# Patient Record
Sex: Female | Born: 1945 | Race: White | Hispanic: No | Smoking: Former smoker
Health system: Southern US, Community
[De-identification: ages and names within clinical notes are randomized; demographics above are authoritative.]

## PROBLEM LIST (undated history)

## (undated) DIAGNOSIS — N3281 Overactive bladder: Secondary | ICD-10-CM

## (undated) DIAGNOSIS — I872 Venous insufficiency (chronic) (peripheral): Secondary | ICD-10-CM

## (undated) HISTORY — PX: CHOLECYSTECTOMY: SHX55

## (undated) HISTORY — DX: Venous insufficiency (chronic) (peripheral): I87.2

## (undated) HISTORY — PX: LAPAROSCOPIC VAGINAL HYSTERECTOMY: SUR798

## (undated) HISTORY — PX: APPENDECTOMY: SHX54

---

## 2020-04-29 ENCOUNTER — Other Ambulatory Visit: Payer: Self-pay | Admitting: Family Medicine

## 2020-04-29 DIAGNOSIS — Z1231 Encounter for screening mammogram for malignant neoplasm of breast: Secondary | ICD-10-CM

## 2020-07-29 ENCOUNTER — Other Ambulatory Visit: Payer: Self-pay

## 2020-07-29 ENCOUNTER — Ambulatory Visit
Admission: RE | Admit: 2020-07-29 | Discharge: 2020-07-29 | Disposition: A | Discharge status: Home / Self Care | Payer: Medicare Other | Source: Ambulatory Visit | Attending: Family Medicine | Admitting: Family Medicine

## 2020-07-29 DIAGNOSIS — Z1231 Encounter for screening mammogram for malignant neoplasm of breast: Secondary | ICD-10-CM | POA: Insufficient documentation

## 2020-08-04 ENCOUNTER — Inpatient Hospital Stay
Admission: RE | Admit: 2020-08-04 | Discharge: 2020-08-04 | Disposition: A | Discharge status: Home / Self Care | Payer: Self-pay | Source: Ambulatory Visit | Attending: *Deleted | Admitting: *Deleted

## 2020-08-04 ENCOUNTER — Other Ambulatory Visit: Payer: Self-pay | Admitting: *Deleted

## 2020-08-04 DIAGNOSIS — Z1231 Encounter for screening mammogram for malignant neoplasm of breast: Secondary | ICD-10-CM

## 2020-08-26 ENCOUNTER — Ambulatory Visit
Admission: RE | Admit: 2020-08-26 | Discharge: 2020-08-26 | Disposition: A | Discharge status: Home / Self Care | Payer: Medicare Other | Source: Ambulatory Visit | Attending: Urology | Admitting: Urology

## 2020-08-26 ENCOUNTER — Other Ambulatory Visit: Payer: Self-pay

## 2020-08-26 ENCOUNTER — Ambulatory Visit: Payer: Medicare Other | Admitting: Urology

## 2020-08-26 ENCOUNTER — Encounter: Payer: Self-pay | Admitting: Urology

## 2020-08-26 VITALS — BP 99/62 | HR 97 | Ht 65.0 in | Wt 134.0 lb

## 2020-08-26 DIAGNOSIS — N3946 Mixed incontinence: Secondary | ICD-10-CM

## 2020-08-26 DIAGNOSIS — R109 Unspecified abdominal pain: Secondary | ICD-10-CM

## 2020-08-26 DIAGNOSIS — N200 Calculus of kidney: Secondary | ICD-10-CM

## 2020-08-26 IMAGING — CR DG ABDOMEN 1V
1 series · 2 of 2 positions shown · non-contrast
Comparison: Abdominopelvic CT [DATE]

CLINICAL DATA: Kidney stone. Right-sided pain. Pain with urination.

EXAM:
ABDOMEN - 1 VIEW

[Series 1: dg abd 1 view · 0.14mm/px · 2 of 2 slices shown]
[im 1/2]
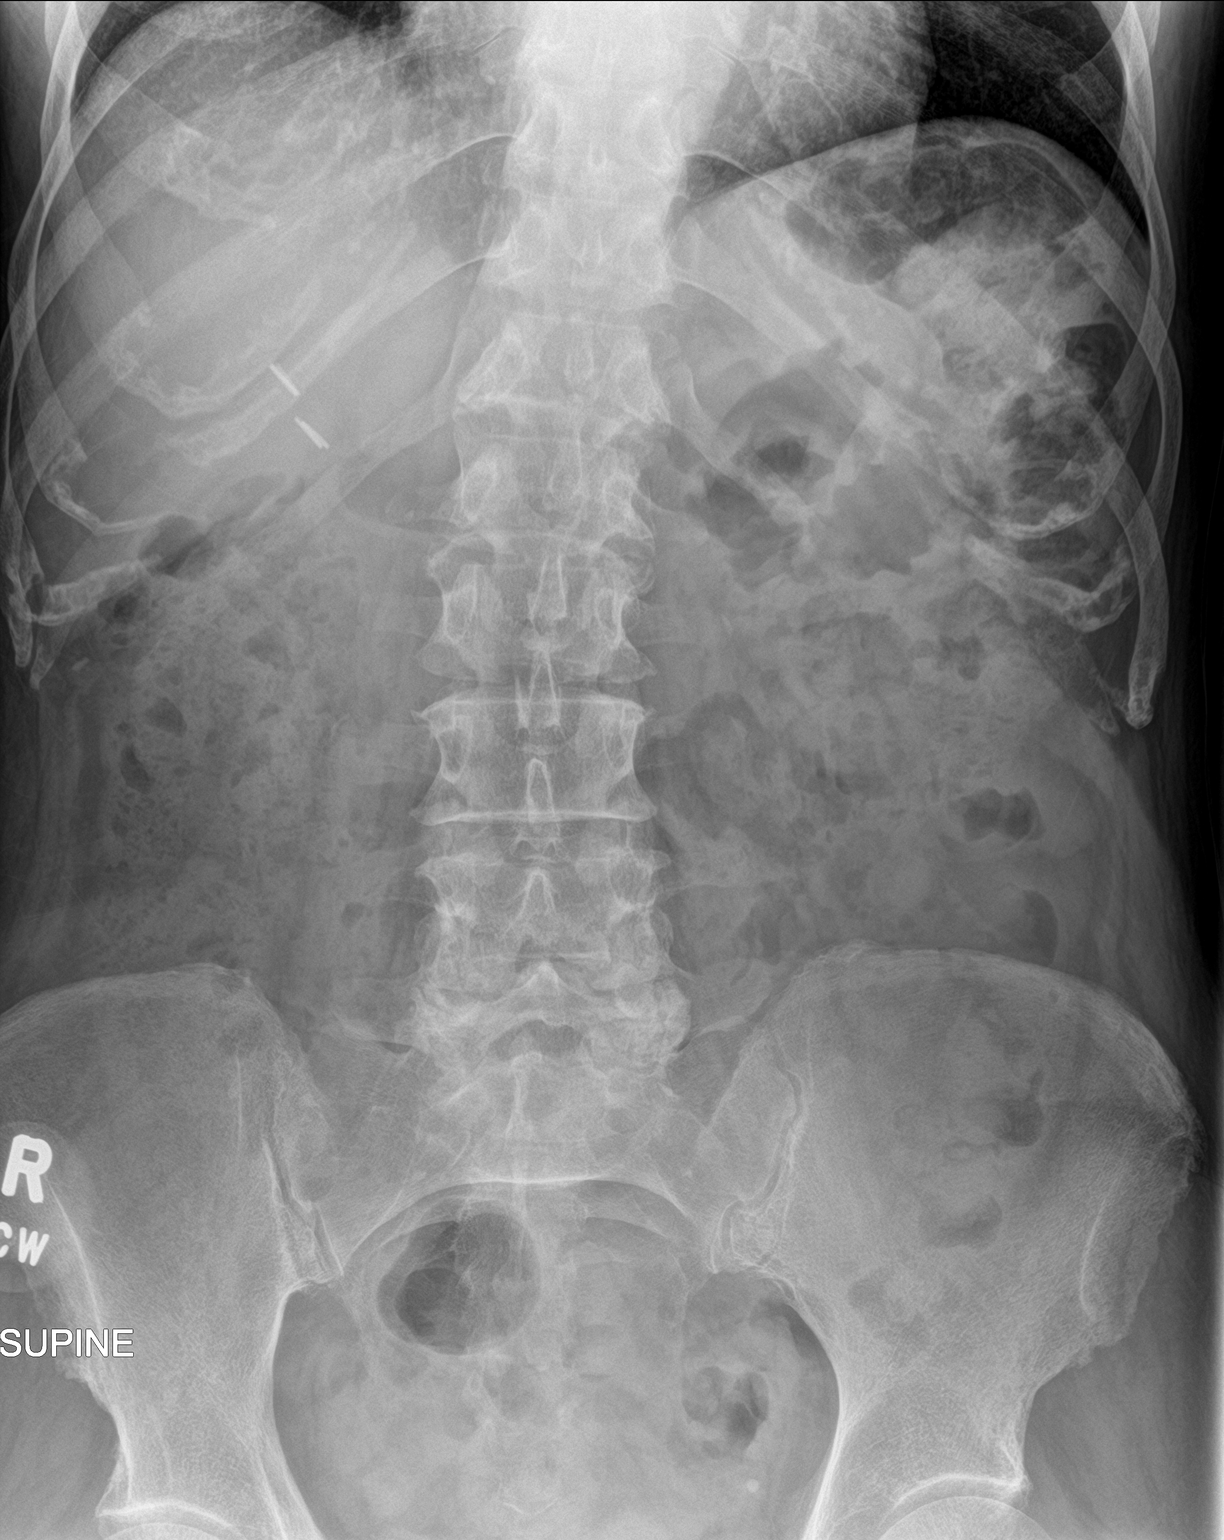
[im 2/2]
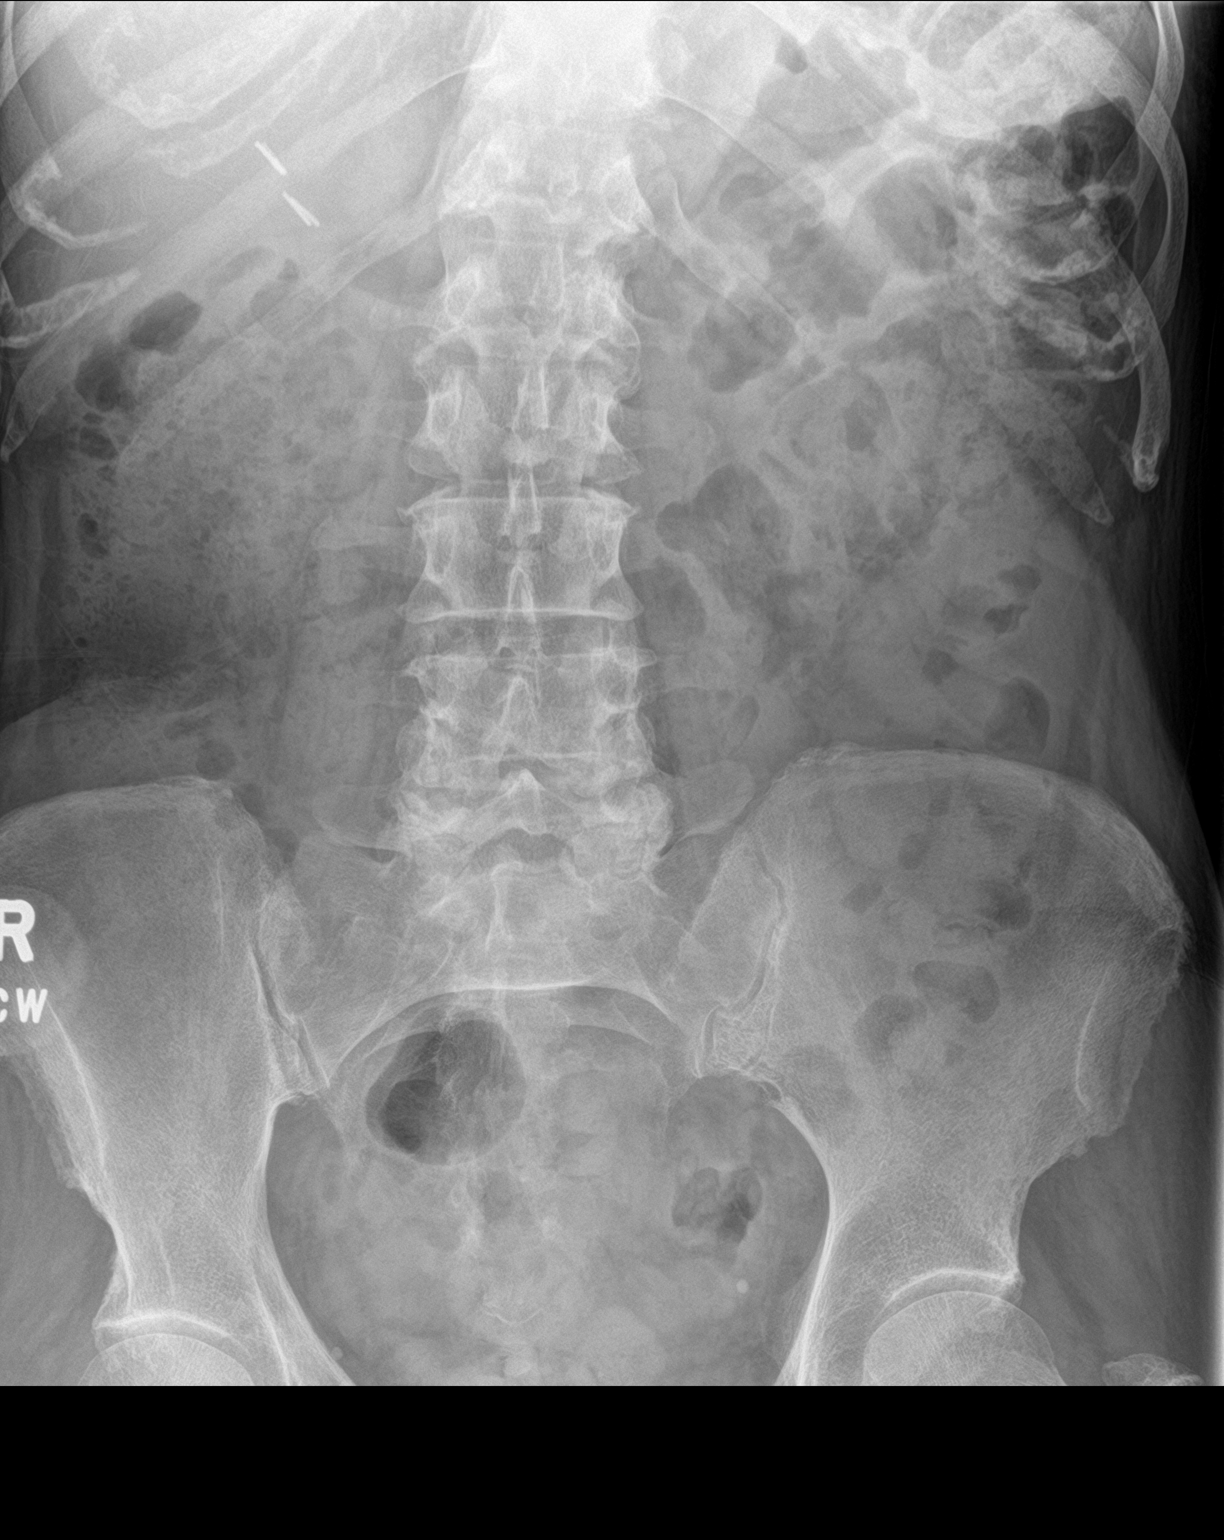

[2 of 2 positions shown; findings below may reference images not displayed]

FINDINGS: No radiopaque calculi or abnormal soft tissue calcifications. Pelvic
calcifications correspond to phleboliths on prior CT. Normal bowel
gas pattern without evidence of obstruction. Moderate stool in the
ascending and transverse colon. Cholecystectomy clips in the right
upper quadrant. No concerning intraabdominal mass effect. No acute
osseous abnormalities are seen
IMPRESSION: 1. No radiopaque calculi.
2. Normal bowel gas pattern.

## 2020-08-26 MED ORDER — OXYBUTYNIN CHLORIDE ER 10 MG PO TB24: 10.0000 mg | ORAL_TABLET | Freq: Every day | ORAL | 0 refills | Status: DC

## 2020-08-26 NOTE — Progress Notes (Signed)
08/26/2020 11:32 AM   Kristina Proctor November 09, 1945 [id number]  Referring provider: Donnamarie Rossetti, PA-C [place],  [place] [address]  Chief Complaint  Patient presents with   Urinary Frequency    HPI: Kristina Proctor is a 75 y.o. female referred for evaluation of overactive bladder.  Saw PCP 08/10/2020 for URI and GI symptoms Referral placed for chronic OAB symptoms Previously followed by Dr. Felipa Eth in [facility] for mixed urinary incontinence; last saw November 2020 Symptoms at those visits remarkable for urinary frequency, urgency with urge and stress incontinence.  Nocturia every 2 hours Initially tried Myrbetriq but discontinued secondary to side effects Was started on Solifenacin with improvement in her symptoms though she does have a side effect of constipation with this medication which is manageable States her co-pay on Solifenacin has increased to $20 per month and she is requesting an alternative medication  States she was also diagnosed with a kidney stone in [place] 2 years ago and for the past several months has noted intermittent right side pain located along the mid axillary line below the costal margin that radiates into the right lower quadrant.  She is inquiring if this could be related to the stone CT scans performed in 2019 and July 2021 showed no renal or ureteral calculi She had seen gastroenterology in June 2021 and the CT was ordered for right flank pain and history of diverticulitis  PMH: Past Medical History:  Diagnosis Date   Venous insufficiency     Surgical History: Past Surgical History:  Procedure Laterality Date   CHOLECYSTECTOMY     LAPAROSCOPIC VAGINAL HYSTERECTOMY      Home Medications:  Allergies as of 08/26/2020       Reactions   Codeine Other (See Comments), Hives   Rapid heart rate Rapid heart rate Rapid heart rate Rapid heart rate Rapid heart rate Rapid heart rate   Hydrocodone Other (See Comments)    Rapid heart rate Rapid heart rate   Oxycodone Other (See Comments), Hives   Rapid heart rate Other reaction(s): Chest Pain Rapid heart rate Rapid heart rate Rapid heart rate Other reaction(s): Chest Pain Rapid heart rate Rapid heart rate Rapid heart rate Rapid heart rate Rapid heart rate   Amitriptyline Nausea Only, Other (See Comments)   Out of focus Out of focus Out of focus Out of focus Out of focus Out of focus   Levofloxacin Rash   Bupropion Nausea Only   Doxycycline Nausea Only   Ciprofloxacin Diarrhea        Medication List        Accurate as of August 26, 2020 11:32 AM. If you have any questions, ask your nurse or doctor.          diclofenac Sodium 1 % Gel Commonly known as: VOLTAREN Apply 2 g topically 4 (four) times daily.   fluconazole 100 MG tablet Commonly known as: DIFLUCAN Take 100 mg by mouth daily.   gentamicin ointment 0.1 % Commonly known as: GARAMYCIN APPLY TO NOSE THREE TIMES DAILY   meloxicam 15 MG tablet Commonly known as: MOBIC Take 15 mg by mouth daily.   mupirocin ointment 2 % Commonly known as: BACTROBAN APPLY TO AFFECTED AREA 3 TIMES A DAY FOR 7 DAYS   omeprazole 40 MG capsule Commonly known as: PRILOSEC Take by mouth.   ondansetron 4 MG disintegrating tablet Commonly known as: ZOFRAN-ODT Take 4 mg by mouth every 8 (eight) hours as needed.   senna-docusate 8.6-50 MG  tablet Commonly known as: Senokot-S Take 1 tablet by mouth daily.   solifenacin 10 MG tablet Commonly known as: VESICARE Take 1 tablet by mouth daily.   triamcinolone cream 0.1 % Commonly known as: KENALOG Apply topically 2 (two) times daily.        Allergies:  Allergies  Allergen Reactions   Codeine Other (See Comments) and Hives    Rapid heart rate Rapid heart rate Rapid heart rate Rapid heart rate Rapid heart rate Rapid heart rate    Hydrocodone Other (See Comments)    Rapid heart rate Rapid heart rate    Oxycodone Other (See  Comments) and Hives    Rapid heart rate Other reaction(s): Chest Pain Rapid heart rate Rapid heart rate Rapid heart rate Other reaction(s): Chest Pain Rapid heart rate Rapid heart rate Rapid heart rate Rapid heart rate Rapid heart rate    Amitriptyline Nausea Only and Other (See Comments)    Out of focus Out of focus Out of focus Out of focus Out of focus Out of focus    Levofloxacin Rash   Bupropion Nausea Only   Doxycycline Nausea Only   Ciprofloxacin Diarrhea    Family History: Family History  Problem Relation Age of Onset   Breast cancer Maternal Aunt    Breast cancer Paternal Aunt     Social History:  reports that she has quit smoking. Her smoking use included cigarettes. She has never used smokeless tobacco. She reports that she does not drink alcohol. No history on file for drug use.   Physical Exam: BP 99/62   Pulse 97   Ht '5\' 5"'$  (1.651 m)   Wt 134 lb (60.8 kg)   BMI 22.30 kg/m   Constitutional:  Alert and oriented, No acute distress. HEENT: [place] AT, moist mucus membranes.  Trachea midline, no masses. Cardiovascular: No clubbing, cyanosis, or edema. Respiratory: Normal respiratory effort, no increased work of breathing. Psychiatric: Normal mood and affect.  Laboratory Data:  Urinalysis Urinalysis 08/10/2020 at [facility] was negative   Assessment & Plan:    1.  Mixed urinary incontinence Urge most bothersome Solifenacin has been effective and I did inform her of the availability of Solifenacin through a different pharmacy that honors good Rx.  The [facility] price is $28 for a 90-day supply.  I offered to send this into a different pharmacy however she prefers to get her medications through [facility] which is more convenient Rx oxybutynin sent as an alternative and if she sees increase side effects she will call back to resume Solifenacin  2.  Right side pain KUB was ordered Since she had a CT 1 year ago for similar symptoms would not recommend  repeating additional imaging if KUB is negative Pain most likely musculoskeletal in etiology   Abbie Sons, MD  [facility] [address] [place], [place] [address] [phone]

## 2020-08-28 ENCOUNTER — Telehealth: Payer: Self-pay | Admitting: *Deleted

## 2020-08-28 NOTE — Telephone Encounter (Signed)
Notified patient as instructed.   

## 2020-08-28 NOTE — Telephone Encounter (Signed)
-----   Message from Abbie Sons, MD sent at 08/28/2020  2:18 PM EDT ----- KUB showed no evidence of stone.  I also reviewed her CT report from 08/01/2019 and the radiology report stated no kidney stones were seen

## 2020-09-23 ENCOUNTER — Other Ambulatory Visit: Payer: Self-pay | Admitting: Urology

## 2020-10-20 ENCOUNTER — Other Ambulatory Visit: Payer: Self-pay | Admitting: Urology

## 2021-02-09 ENCOUNTER — Ambulatory Visit
Admission: RE | Admit: 2021-02-09 | Discharge: 2021-02-09 | Disposition: A | Discharge status: Home / Self Care | Payer: Medicare HMO | Source: Ambulatory Visit | Attending: Family Medicine | Admitting: Family Medicine

## 2021-02-09 ENCOUNTER — Other Ambulatory Visit (HOSPITAL_COMMUNITY): Payer: Self-pay | Admitting: Family Medicine

## 2021-02-09 ENCOUNTER — Other Ambulatory Visit: Payer: Self-pay | Admitting: Family Medicine

## 2021-02-09 DIAGNOSIS — R1013 Epigastric pain: Secondary | ICD-10-CM | POA: Diagnosis not present

## 2021-02-09 DIAGNOSIS — R3989 Other symptoms and signs involving the genitourinary system: Secondary | ICD-10-CM | POA: Diagnosis not present

## 2021-02-09 DIAGNOSIS — R109 Unspecified abdominal pain: Secondary | ICD-10-CM

## 2021-02-09 DIAGNOSIS — R300 Dysuria: Secondary | ICD-10-CM | POA: Diagnosis not present

## 2021-02-09 DIAGNOSIS — R110 Nausea: Secondary | ICD-10-CM | POA: Diagnosis not present

## 2021-02-09 DIAGNOSIS — K7689 Other specified diseases of liver: Secondary | ICD-10-CM | POA: Diagnosis not present

## 2021-02-09 DIAGNOSIS — K5730 Diverticulosis of large intestine without perforation or abscess without bleeding: Secondary | ICD-10-CM | POA: Diagnosis not present

## 2021-02-09 DIAGNOSIS — I700 Atherosclerosis of aorta: Secondary | ICD-10-CM | POA: Diagnosis not present

## 2021-02-09 LAB — POCT I-STAT CREATININE: Creatinine, Ser: 0.7 mg/dL (ref 0.44–1.00)

## 2021-02-09 MED ORDER — IOHEXOL 300 MG/ML  SOLN
80.0000 mL | Freq: Once | INTRAMUSCULAR | Status: AC | PRN
  Administered 2021-02-09: 80 mL via INTRAVENOUS

## 2021-02-25 DIAGNOSIS — R0602 Shortness of breath: Secondary | ICD-10-CM | POA: Diagnosis not present

## 2021-02-25 DIAGNOSIS — R931 Abnormal findings on diagnostic imaging of heart and coronary circulation: Secondary | ICD-10-CM | POA: Diagnosis not present

## 2021-02-25 DIAGNOSIS — I2510 Atherosclerotic heart disease of native coronary artery without angina pectoris: Secondary | ICD-10-CM | POA: Diagnosis not present

## 2021-02-25 DIAGNOSIS — R0789 Other chest pain: Secondary | ICD-10-CM | POA: Diagnosis not present

## 2021-02-25 DIAGNOSIS — R9439 Abnormal result of other cardiovascular function study: Secondary | ICD-10-CM | POA: Diagnosis not present

## 2021-02-26 DIAGNOSIS — I2510 Atherosclerotic heart disease of native coronary artery without angina pectoris: Secondary | ICD-10-CM | POA: Diagnosis not present

## 2021-02-26 DIAGNOSIS — I208 Other forms of angina pectoris: Secondary | ICD-10-CM | POA: Diagnosis not present

## 2021-03-05 ENCOUNTER — Encounter
Admission: RE | Disposition: A | Discharge status: Home / Self Care | Payer: Self-pay | Source: Home / Self Care | Attending: Cardiology

## 2021-03-05 ENCOUNTER — Other Ambulatory Visit: Payer: Self-pay

## 2021-03-05 ENCOUNTER — Ambulatory Visit
Admission: RE | Admit: 2021-03-05 | Discharge: 2021-03-05 | Disposition: A | Discharge status: Home / Self Care | Payer: Medicare HMO | Attending: Cardiology | Admitting: Cardiology

## 2021-03-05 ENCOUNTER — Encounter: Payer: Self-pay | Admitting: Cardiology

## 2021-03-05 DIAGNOSIS — I25118 Atherosclerotic heart disease of native coronary artery with other forms of angina pectoris: Secondary | ICD-10-CM | POA: Diagnosis not present

## 2021-03-05 DIAGNOSIS — I2089 Other forms of angina pectoris: Secondary | ICD-10-CM | POA: Diagnosis present

## 2021-03-05 DIAGNOSIS — I208 Other forms of angina pectoris: Secondary | ICD-10-CM | POA: Diagnosis present

## 2021-03-05 DIAGNOSIS — Z9289 Personal history of other medical treatment: Secondary | ICD-10-CM

## 2021-03-05 DIAGNOSIS — I25119 Atherosclerotic heart disease of native coronary artery with unspecified angina pectoris: Secondary | ICD-10-CM | POA: Diagnosis not present

## 2021-03-05 DIAGNOSIS — Z87891 Personal history of nicotine dependence: Secondary | ICD-10-CM | POA: Insufficient documentation

## 2021-03-05 DIAGNOSIS — Z955 Presence of coronary angioplasty implant and graft: Secondary | ICD-10-CM | POA: Diagnosis not present

## 2021-03-05 HISTORY — DX: Overactive bladder: N32.81

## 2021-03-05 HISTORY — PX: CORONARY STENT INTERVENTION: CATH118234

## 2021-03-05 HISTORY — PX: LEFT HEART CATH AND CORONARY ANGIOGRAPHY: CATH118249

## 2021-03-05 LAB — POCT ACTIVATED CLOTTING TIME
Activated Clotting Time: 263 seconds
Activated Clotting Time: 317 seconds

## 2021-03-05 SURGERY — LEFT HEART CATH AND CORONARY ANGIOGRAPHY
Anesthesia: Moderate Sedation

## 2021-03-05 MED ORDER — ASPIRIN 81 MG PO CHEW
CHEWABLE_TABLET | ORAL | Status: AC
  Filled 2021-03-05: qty 3

## 2021-03-05 MED ORDER — VERAPAMIL HCL 2.5 MG/ML IV SOLN
INTRAVENOUS | Status: DC | PRN
  Administered 2021-03-05: 10 mg via INTRA_ARTERIAL

## 2021-03-05 MED ORDER — SODIUM CHLORIDE 0.9 % IV SOLN: 250.0000 mL | INTRAVENOUS | Status: DC | PRN

## 2021-03-05 MED ORDER — VERAPAMIL HCL 2.5 MG/ML IV SOLN
INTRAVENOUS | Status: AC
  Filled 2021-03-05: qty 2

## 2021-03-05 MED ORDER — SODIUM CHLORIDE 0.9 % IV SOLN: INTRAVENOUS | Status: DC

## 2021-03-05 MED ORDER — MIDAZOLAM HCL 2 MG/2ML IJ SOLN
INTRAMUSCULAR | Status: DC | PRN
  Administered 2021-03-05: 1 mg via INTRAVENOUS

## 2021-03-05 MED ORDER — ROSUVASTATIN CALCIUM 10 MG PO TABS: 10.0000 mg | ORAL_TABLET | Freq: Every day | ORAL | Status: DC

## 2021-03-05 MED ORDER — CLOPIDOGREL BISULFATE 75 MG PO TABS
ORAL_TABLET | ORAL | Status: AC
  Filled 2021-03-05: qty 8

## 2021-03-05 MED ORDER — FENTANYL CITRATE (PF) 100 MCG/2ML IJ SOLN
INTRAMUSCULAR | Status: AC
  Filled 2021-03-05: qty 2

## 2021-03-05 MED ORDER — MELATONIN 5 MG PO TABS
10.0000 mg | ORAL_TABLET | Freq: Every evening | ORAL | Status: DC | PRN
  Filled 2021-03-05: qty 2

## 2021-03-05 MED ORDER — BACLOFEN 10 MG PO TABS: 10.0000 mg | ORAL_TABLET | Freq: Every day | ORAL | Status: DC

## 2021-03-05 MED ORDER — CLOPIDOGREL BISULFATE 75 MG PO TABS: 75.0000 mg | ORAL_TABLET | Freq: Every day | ORAL | Status: DC

## 2021-03-05 MED ORDER — CLOPIDOGREL BISULFATE 75 MG PO TABS
75.0000 mg | ORAL_TABLET | Freq: Every day | ORAL | 3 refills | Status: AC
Start: 2021-03-05 — End: 2022-03-05

## 2021-03-05 MED ORDER — LIDOCAINE HCL (PF) 1 % IJ SOLN
INTRAMUSCULAR | Status: DC | PRN
  Administered 2021-03-05: 2 mL

## 2021-03-05 MED ORDER — HEPARIN (PORCINE) IN NACL 1000-0.9 UT/500ML-% IV SOLN
INTRAVENOUS | Status: DC | PRN
  Administered 2021-03-05 (×2): 500 mL

## 2021-03-05 MED ORDER — IOHEXOL 300 MG/ML  SOLN
INTRAMUSCULAR | Status: DC | PRN
  Administered 2021-03-05: 105 mL

## 2021-03-05 MED ORDER — SODIUM CHLORIDE 0.9% FLUSH: 3.0000 mL | Freq: Two times a day (BID) | INTRAVENOUS | Status: DC

## 2021-03-05 MED ORDER — SODIUM CHLORIDE 0.9 % IV SOLN: INTRAVENOUS | Status: AC

## 2021-03-05 MED ORDER — SODIUM CHLORIDE 0.9% FLUSH: 3.0000 mL | INTRAVENOUS | Status: DC | PRN

## 2021-03-05 MED ORDER — HEPARIN (PORCINE) IN NACL 1000-0.9 UT/500ML-% IV SOLN
INTRAVENOUS | Status: AC
  Filled 2021-03-05: qty 1000

## 2021-03-05 MED ORDER — TRAZODONE HCL 50 MG PO TABS: 50.0000 mg | ORAL_TABLET | Freq: Every day | ORAL | Status: DC

## 2021-03-05 MED ORDER — HEPARIN SODIUM (PORCINE) 1000 UNIT/ML IJ SOLN
INTRAMUSCULAR | Status: DC | PRN
  Administered 2021-03-05: 2000 [IU] via INTRAVENOUS
  Administered 2021-03-05: 3000 [IU] via INTRAVENOUS
  Administered 2021-03-05: 4000 [IU] via INTRAVENOUS

## 2021-03-05 MED ORDER — FLUTICASONE PROPIONATE 50 MCG/ACT NA SUSP
2.0000 | Freq: Every day | NASAL | Status: DC
Start: 2021-03-06 — End: 2021-03-05

## 2021-03-05 MED ORDER — METOPROLOL SUCCINATE ER 50 MG PO TB24: 25.0000 mg | ORAL_TABLET | Freq: Every day | ORAL | Status: DC

## 2021-03-05 MED ORDER — ASPIRIN 81 MG PO CHEW
324.0000 mg | CHEWABLE_TABLET | ORAL | Status: AC
  Administered 2021-03-05: 243 mg via ORAL

## 2021-03-05 MED ORDER — CLOPIDOGREL BISULFATE 75 MG PO TABS
ORAL_TABLET | ORAL | Status: DC | PRN
  Administered 2021-03-05: 600 mg via ORAL

## 2021-03-05 MED ORDER — DIPHENHYDRAMINE HCL 25 MG PO TABS: 25.0000 mg | ORAL_TABLET | Freq: Every day | ORAL | Status: DC

## 2021-03-05 MED ORDER — MIDAZOLAM HCL 2 MG/2ML IJ SOLN
INTRAMUSCULAR | Status: AC
  Filled 2021-03-05: qty 2

## 2021-03-05 MED ORDER — HEPARIN SODIUM (PORCINE) 1000 UNIT/ML IJ SOLN
INTRAMUSCULAR | Status: AC
  Filled 2021-03-05: qty 10

## 2021-03-05 MED ORDER — ACETAMINOPHEN 325 MG PO TABS: 650.0000 mg | ORAL_TABLET | ORAL | Status: DC | PRN

## 2021-03-05 MED ORDER — ASPIRIN EC 81 MG PO TBEC: 81.0000 mg | DELAYED_RELEASE_TABLET | Freq: Every day | ORAL | Status: DC

## 2021-03-05 MED ORDER — ONDANSETRON HCL 4 MG/2ML IJ SOLN: 4.0000 mg | Freq: Four times a day (QID) | INTRAMUSCULAR | Status: DC | PRN

## 2021-03-05 SURGICAL SUPPLY — 21 items
BALLN TREK RX 2.25X12 (BALLOONS) ×2
BALLN ~~LOC~~ EUPHORA RX 2.5X15 (BALLOONS) ×2
BALLOON TREK RX 2.25X12 (BALLOONS) IMPLANT
BALLOON ~~LOC~~ EUPHORA RX 2.5X15 (BALLOONS) IMPLANT
CATH EAGLE EYE PLAT IMAGING (CATHETERS) ×1 IMPLANT
CATH INFINITI JR4 5F (CATHETERS) ×1 IMPLANT
CATH LAUNCHER 5F EBU3.5 (CATHETERS) ×1 IMPLANT
CATH LAUNCHER 6FR EBU3.5 (CATHETERS) ×1 IMPLANT
DEVICE RAD TR BAND REGULAR (VASCULAR PRODUCTS) ×1 IMPLANT
DRAPE BRACHIAL (DRAPES) ×1 IMPLANT
GLIDESHEATH SLEND SS 6F .021 (SHEATH) ×1 IMPLANT
GUIDEWIRE INQWIRE 1.5J.035X260 (WIRE) IMPLANT
INQWIRE 1.5J .035X260CM (WIRE) ×2
KIT ENCORE 26 ADVANTAGE (KITS) ×1 IMPLANT
PACK CARDIAC CATH (CUSTOM PROCEDURE TRAY) ×2 IMPLANT
PROTECTION STATION PRESSURIZED (MISCELLANEOUS) ×2
SET ATX SIMPLICITY (MISCELLANEOUS) ×1 IMPLANT
STATION PROTECTION PRESSURIZED (MISCELLANEOUS) IMPLANT
STENT ONYX FRONTIER 2.5X18 (Permanent Stent) ×1 IMPLANT
TUBING CIL FLEX 10 FLL-RA (TUBING) ×1 IMPLANT
WIRE RUNTHROUGH .014X180CM (WIRE) ×1 IMPLANT

## 2021-03-05 NOTE — H&P (Signed)
[facility] Cardiology History and Physical  Patient ID: Kristina Proctor MRN: [id number] DOB/AGE: 10/18/1945 76 y.o. Admit date: 03/05/2021  Primary Care Physician: Donnamarie Rossetti, PA-C Primary Cardiologist Andrez Grime, MD   HPI:  Kristina Proctor is a 76 year old female with history of tobacco use who was recently evaluated for shortness of breath with exertion and abnormal stress test.  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  She is referred for heart catheterization for further evaluation.    Past Medical History:  Diagnosis Date   Overactive bladder    Venous insufficiency     Past Surgical History:  Procedure Laterality Date   APPENDECTOMY     CHOLECYSTECTOMY     LAPAROSCOPIC VAGINAL HYSTERECTOMY      Medications Prior to Admission  Medication Sig Dispense Refill Last Dose   aspirin EC 81 MG tablet Take 81 mg by mouth daily. Swallow whole.   03/05/2021   baclofen (LIORESAL) 10 MG tablet Take 10 mg by mouth daily.   03/04/2021   Calcium Carb-Cholecalciferol 500-10 MG-MCG CHEW Chew 2 each by mouth daily.   03/04/2021   diclofenac Sodium (VOLTAREN) 1 % GEL Apply 2 g topically 4 (four) times daily as needed (pain).   Past Week   diphenhydrAMINE (BENADRYL) 25 MG tablet Take 25 mg by mouth daily.   03/04/2021   estradiol (ESTRACE) 0.1 MG/GM vaginal cream Place 1 Applicatorful vaginally once a week.   Past Week   fluticasone (FLONASE) 50 MCG/ACT nasal spray Place 2 sprays into both nostrils daily.   03/04/2021   gentamicin ointment (GARAMYCIN) 0.1 % Apply 1 application topically 3 (three) times daily as needed (irritation around nose).   Past Week   Melatonin 10 MG TABS Take 10 mg by mouth at bedtime as needed (sleep).   03/04/2021   metoprolol succinate (TOPROL-XL) 25 MG 24 hr tablet Take 25 mg by mouth daily.   03/05/2021   Multiple Vitamins-Minerals (MULTIVITAMIN WITH MINERALS) tablet Take 2 tablets by mouth daily.   03/04/2021   Peppermint Oil (IBGARD) 90  MG CPCR Take 90 mg by mouth daily.   03/04/2021   rosuvastatin (CRESTOR) 10 MG tablet Take 10 mg by mouth daily.   03/04/2021   solifenacin (VESICARE) 10 MG tablet Take 10 mg by mouth daily as needed (overactive bladder).   03/04/2021   traZODone (DESYREL) 50 MG tablet Take 50 mg by mouth at bedtime.   03/04/2021   Social History   Socioeconomic History   Marital status: Widowed    Spouse name: Not on file   Number of children: Not on file   Years of education: Not on file   Highest education level: Not on file  Occupational History   Not on file  Tobacco Use   Smoking status: Former    Types: Cigarettes   Smokeless tobacco: Never  Substance and Sexual Activity   Alcohol use: Never   Drug use: Never   Sexual activity: Not on file  Other Topics Concern   Not on file  Social History Narrative   ** Merged History Encounter **       Social Determinants of Health   Financial Resource Strain: Not on file  Food Insecurity: Not on file  Transportation Needs: Not on file  Physical Activity: Not on file  Stress: Not on file  Social Connections: Not on file  Intimate Partner Violence: Not on file    Family History  Problem Relation Age of Onset  Breast cancer Maternal Aunt    Breast cancer Paternal Aunt       Review of systems complete and found to be negative unless listed above      Physical Exam:  General: Well developed, well nourished, in no acute distress HEENT:  Normocephalic and atramatic Neck:  No JVD.  Lungs: Clear bilaterally to auscultation and percussion. Heart: HRRR . Normal S1 and S2 without gallops or murmurs.  Abdomen: Bowel sounds are positive, abdomen soft and non-tender  Msk:  Back normal, normal gait. Normal strength and tone for age. Extremities: No clubbing, cyanosis or edema.   Neuro: Alert and oriented X 3. Psych:  Good affect, responds appropriately   Labs:  No results found for: WBC, HGB, HCT, MCV, PLT No results for input(s): NA, K, CL, CO2,  BUN, CREATININE, CALCIUM, PROT, BILITOT, ALKPHOS, ALT, AST, GLUCOSE in the last 168 hours.  Invalid input(s): LABALBU No results found for: CKTOTAL, CKMB, CKMBINDEX, TROPONINI No results found for: CHOL No results found for: HDL No results found for: LDLCALC No results found for: TRIG No results found for: CHOLHDL No results found for: LDLDIRECT    Radiology: CT ABDOMEN PELVIS W CONTRAST  Result Date: 02/09/2021 CLINICAL DATA:  Right flank pain for 2 days.  Dysuria. EXAM: CT ABDOMEN AND PELVIS WITH CONTRAST TECHNIQUE: Multidetector CT imaging of the abdomen and pelvis was performed using the standard protocol following bolus administration of intravenous contrast. CONTRAST:  61mL OMNIPAQUE IOHEXOL 300 MG/ML  SOLN COMPARISON:  08/01/2019 FINDINGS: Lower Chest: No acute findings. Hepatobiliary: No hepatic masses identified. Several small hepatic cysts remains stable. Prior cholecystectomy again noted. Mild biliary ductal dilatation is also unchanged. Pancreas: No mass or inflammatory changes. No evidence pancreatic ductal dilatation. Spleen: Within normal limits in size and appearance. Adrenals/Urinary Tract: No masses identified. No evidence of ureteral calculi or hydronephrosis. Stomach/Bowel: No evidence of obstruction, inflammatory process or abnormal fluid collections. Diverticulosis is seen mainly involving the descending and sigmoid colon, however there is no evidence of diverticulitis. Vascular/Lymphatic: No pathologically enlarged lymph nodes. No acute vascular findings. Aortic atherosclerotic calcification noted. Reproductive: Prior hysterectomy noted. Adnexal regions are unremarkable in appearance. Other:  None. Musculoskeletal:  No suspicious bone lesions identified. IMPRESSION: No acute findings. Colonic diverticulosis, without radiographic evidence of diverticulitis. Aortic Atherosclerosis (ICD10-I70.0). Electronically Signed   By: Marlaine Hind M.D.   On: 02/09/2021 17:44    EKG:  Normal  ASSESSMENT AND PLAN:  76 year old female with history of tobacco use who presents following a abnormal stress test and cardiac CTA showing a 90% proximal LAD lesion.  After discussion with the patient we will proceed with heart catheterization with possible intervention for further evaluation.  The risks and benefits were discussed with the patient and her daughter who are agreeable.  To proceed.  We will proceed with a right radial artery approach.  Signed: Andrez Grime MD 03/05/2021, 10:14 AM

## 2021-03-08 ENCOUNTER — Encounter: Payer: Self-pay | Admitting: Cardiology

## 2021-03-15 ENCOUNTER — Encounter: Payer: Medicare HMO | Attending: Cardiology

## 2021-03-15 ENCOUNTER — Other Ambulatory Visit: Payer: Self-pay

## 2021-03-15 DIAGNOSIS — I208 Other forms of angina pectoris: Secondary | ICD-10-CM | POA: Insufficient documentation

## 2021-03-15 DIAGNOSIS — Z955 Presence of coronary angioplasty implant and graft: Secondary | ICD-10-CM | POA: Insufficient documentation

## 2021-03-15 NOTE — Progress Notes (Signed)
Virtual Visit completed. Patient informed on EP and RD appointment and 6 Minute walk test. Patient also informed of patient health questionnaires on My Chart. Patient Verbalizes understanding. Visit diagnosis can be found in [facility] 03/05/2021.

## 2021-03-16 DIAGNOSIS — M79641 Pain in right hand: Secondary | ICD-10-CM | POA: Diagnosis not present

## 2021-03-17 DIAGNOSIS — M9904 Segmental and somatic dysfunction of sacral region: Secondary | ICD-10-CM | POA: Diagnosis not present

## 2021-03-17 DIAGNOSIS — M9905 Segmental and somatic dysfunction of pelvic region: Secondary | ICD-10-CM | POA: Diagnosis not present

## 2021-03-17 DIAGNOSIS — M5431 Sciatica, right side: Secondary | ICD-10-CM | POA: Diagnosis not present

## 2021-03-17 DIAGNOSIS — M9903 Segmental and somatic dysfunction of lumbar region: Secondary | ICD-10-CM | POA: Diagnosis not present

## 2021-03-24 DIAGNOSIS — M545 Low back pain, unspecified: Secondary | ICD-10-CM | POA: Diagnosis not present

## 2021-03-24 DIAGNOSIS — I200 Unstable angina: Secondary | ICD-10-CM | POA: Diagnosis not present

## 2021-03-24 DIAGNOSIS — Z0000 Encounter for general adult medical examination without abnormal findings: Secondary | ICD-10-CM | POA: Diagnosis not present

## 2021-03-24 DIAGNOSIS — I208 Other forms of angina pectoris: Secondary | ICD-10-CM | POA: Diagnosis not present

## 2021-03-24 DIAGNOSIS — M461 Sacroiliitis, not elsewhere classified: Secondary | ICD-10-CM | POA: Diagnosis not present

## 2021-03-24 DIAGNOSIS — M79641 Pain in right hand: Secondary | ICD-10-CM | POA: Diagnosis not present

## 2021-03-24 DIAGNOSIS — I2510 Atherosclerotic heart disease of native coronary artery without angina pectoris: Secondary | ICD-10-CM | POA: Diagnosis not present

## 2021-03-24 DIAGNOSIS — R232 Flushing: Secondary | ICD-10-CM | POA: Diagnosis not present

## 2021-03-24 DIAGNOSIS — G8929 Other chronic pain: Secondary | ICD-10-CM | POA: Diagnosis not present

## 2021-03-29 ENCOUNTER — Encounter: Payer: Medicare HMO | Admitting: *Deleted

## 2021-03-29 ENCOUNTER — Other Ambulatory Visit: Payer: Self-pay

## 2021-03-29 VITALS — Ht 65.75 in | Wt 147.4 lb

## 2021-03-29 DIAGNOSIS — Z955 Presence of coronary angioplasty implant and graft: Secondary | ICD-10-CM

## 2021-03-29 DIAGNOSIS — I208 Other forms of angina pectoris: Secondary | ICD-10-CM | POA: Diagnosis not present

## 2021-03-29 NOTE — Patient Instructions (Signed)
Patient Instructions  Patient Details  Name: Kristina Proctor MRN: [id number] Date of Birth: 1946/01/08 Referring Provider:  Andrez Grime, MD  Below are your personal goals for exercise, nutrition, and risk factors. Our goal is to help you stay on track towards obtaining and maintaining these goals. We will be discussing your progress on these goals with you throughout the program.  Initial Exercise Prescription:  Initial Exercise Prescription - 03/29/21 1600       Date of Initial Exercise RX and Referring Provider   Date 03/29/21    Referring Provider Corky Sox      Oxygen   Maintain Oxygen Saturation 88% or higher      Treadmill   MPH 3    Grade 1    Minutes 15    METs 3.71      Recumbant Bike   Level 2    RPM 60    Minutes 15    METs 3.41      REL-XR   Level 3    Speed 50    Minutes 15    METs 3.41      T5 Nustep   Level 2    SPM 80    Minutes 15    METs 3.41      Prescription Details   Frequency (times per week) 2    Duration Progress to 30 minutes of continuous aerobic without signs/symptoms of physical distress      Intensity   THRR 40-80% of Max Heartrate 102-130    Ratings of Perceived Exertion 11-13    Perceived Dyspnea 0-4      Progression   Progression Continue to progress workloads to maintain intensity without signs/symptoms of physical distress.      Resistance Training   Training Prescription Yes    Weight 2    Reps 10-15             Exercise Goals: Frequency: Be able to perform aerobic exercise two to three times per week in program working toward 2-5 days per week of home exercise.  Intensity: Work with a perceived exertion of 11 (fairly light) - 15 (hard) while following your exercise prescription.  We will make changes to your prescription with you as you progress through the program.   Duration: Be able to do 30 to 45 minutes of continuous aerobic exercise in addition to a 5 minute warm-up and a 5 minute cool-down routine.    Nutrition Goals: Your personal nutrition goals will be established when you do your nutrition analysis with the dietician.  The following are general nutrition guidelines to follow: Cholesterol < 200mg /day Sodium < 1500mg /day Fiber: Women over 50 yrs - 21 grams per day  Personal Goals:  Personal Goals and Risk Factors at Admission - 03/29/21 1610       Core Components/Risk Factors/Patient Goals on Admission    Weight Management Yes;Weight Maintenance    Intervention Weight Management: Develop a combined nutrition and exercise program designed to reach desired caloric intake, while maintaining appropriate intake of nutrient and fiber, sodium and fats, and appropriate energy expenditure required for the weight goal.;Weight Management: Provide education and appropriate resources to help participant work on and attain dietary goals.;Weight Management/Obesity: Establish reasonable short term and long term weight goals.    Admit Weight 147 lb 6.4 oz (66.9 kg)    Goal Weight: Short Term 147 lb 6.4 oz (66.9 kg)    Goal Weight: Long Term 147 lb 6.4 oz (66.9 kg)    Expected  Outcomes Short Term: Continue to assess and modify interventions until short term weight is achieved;Long Term: Adherence to nutrition and physical activity/exercise program aimed toward attainment of established weight goal;Weight Maintenance: Understanding of the daily nutrition guidelines, which includes 25-35% calories from fat, 7% or less cal from saturated fats, less than 200mg  cholesterol, less than 1.5gm of sodium, & 5 or more servings of fruits and vegetables daily;Understanding recommendations for meals to include 15-35% energy as protein, 25-35% energy from fat, 35-60% energy from carbohydrates, less than 200mg  of dietary cholesterol, 20-35 gm of total fiber daily;Understanding of distribution of calorie intake throughout the day with the consumption of 4-5 meals/snacks    Lipids Yes    Intervention Provide education and  support for participant on nutrition & aerobic/resistive exercise along with prescribed medications to achieve LDL 70mg , HDL >40mg .    Expected Outcomes Short Term: Participant states understanding of desired cholesterol values and is compliant with medications prescribed. Participant is following exercise prescription and nutrition guidelines.;Long Term: Cholesterol controlled with medications as prescribed, with individualized exercise RX and with personalized nutrition plan. Value goals: LDL < 70mg , HDL > 40 mg.             Tobacco Use Initial Evaluation: Social History   Tobacco Use  Smoking Status Former   Packs/day: 1.00   Years: 30.00   Pack years: 30.00   Types: Cigarettes   Quit date: 03/03/2016   Years since quitting: 5.0  Smokeless Tobacco Never    Exercise Goals and Review:  Exercise Goals     Row Name 03/29/21 1608             Exercise Goals   Increase Physical Activity Yes       Intervention Provide advice, education, support and counseling about physical activity/exercise needs.;Develop an individualized exercise prescription for aerobic and resistive training based on initial evaluation findings, risk stratification, comorbidities and participant's personal goals.       Expected Outcomes Short Term: Attend rehab on a regular basis to increase amount of physical activity.;Long Term: Add in home exercise to make exercise part of routine and to increase amount of physical activity.;Long Term: Exercising regularly at least 3-5 days a week.       Increase Strength and Stamina Yes       Intervention Provide advice, education, support and counseling about physical activity/exercise needs.;Develop an individualized exercise prescription for aerobic and resistive training based on initial evaluation findings, risk stratification, comorbidities and participant's personal goals.       Expected Outcomes Short Term: Increase workloads from initial exercise prescription for  resistance, speed, and METs.;Short Term: Perform resistance training exercises routinely during rehab and add in resistance training at home;Long Term: Improve cardiorespiratory fitness, muscular endurance and strength as measured by increased METs and functional capacity (6MWT)       Able to understand and use rate of perceived exertion (RPE) scale Yes       Intervention Provide education and explanation on how to use RPE scale       Expected Outcomes Short Term: Able to use RPE daily in rehab to express subjective intensity level;Long Term:  Able to use RPE to guide intensity level when exercising independently       Able to understand and use Dyspnea scale Yes       Intervention Provide education and explanation on how to use Dyspnea scale       Expected Outcomes Short Term: Able to use Dyspnea scale daily in  rehab to express subjective sense of shortness of breath during exertion;Long Term: Able to use Dyspnea scale to guide intensity level when exercising independently       Knowledge and understanding of Target Heart Rate Range (THRR) Yes       Intervention Provide education and explanation of THRR including how the numbers were predicted and where they are located for reference       Expected Outcomes Short Term: Able to state/look up THRR;Long Term: Able to use THRR to govern intensity when exercising independently;Short Term: Able to use daily as guideline for intensity in rehab       Able to check pulse independently Yes       Intervention Provide education and demonstration on how to check pulse in carotid and radial arteries.;Review the importance of being able to check your own pulse for safety during independent exercise       Expected Outcomes Short Term: Able to explain why pulse checking is important during independent exercise;Long Term: Able to check pulse independently and accurately       Understanding of Exercise Prescription Yes       Intervention Provide education, explanation,  and written materials on patient's individual exercise prescription       Expected Outcomes Short Term: Able to explain program exercise prescription;Long Term: Able to explain home exercise prescription to exercise independently                Copy of goals given to participant.

## 2021-03-29 NOTE — Progress Notes (Signed)
Cardiac Individual Treatment Plan  Patient Details  Name: Kristina Proctor MRN: [id number] Date of Birth: 07/27/1945 Referring Provider:   Flowsheet Row Cardiac Rehab from 03/29/2021 in [facility] Cardiac and Pulmonary Rehab  Referring Provider Corky Sox       Initial Encounter Date:  Flowsheet Row Cardiac Rehab from 03/29/2021 in [facility] Cardiac and Pulmonary Rehab  Date 03/29/21       Visit Diagnosis: Status post coronary artery stent placement  Patient's Home Medications on Admission:  Current Outpatient Medications:    aspirin 81 MG chewable tablet, Chew by mouth. (Patient not taking: Reported on 03/15/2021), Disp: , Rfl:    aspirin EC 81 MG tablet, Take 81 mg by mouth daily. Swallow whole., Disp: , Rfl:    baclofen (LIORESAL) 10 MG tablet, Take 10 mg by mouth daily., Disp: , Rfl:    Calcium Carb-Cholecalciferol 500-10 MG-MCG CHEW, Chew 2 each by mouth daily., Disp: , Rfl:    clopidogrel (PLAVIX) 75 MG tablet, Take 1 tablet (75 mg total) by mouth daily., Disp: 90 tablet, Rfl: 3   diclofenac Sodium (VOLTAREN) 1 % GEL, Apply 2 g topically 4 (four) times daily as needed (pain). (Patient not taking: Reported on 03/15/2021), Disp: , Rfl:    diphenhydrAMINE (BENADRYL) 25 MG tablet, Take 25 mg by mouth daily., Disp: , Rfl:    estradiol (ESTRACE) 0.1 MG/GM vaginal cream, Place 1 Applicatorful vaginally once a week., Disp: , Rfl:    fluticasone (FLONASE) 50 MCG/ACT nasal spray, Place 2 sprays into both nostrils daily., Disp: , Rfl:    gentamicin ointment (GARAMYCIN) 0.1 %, Apply 1 application topically 3 (three) times daily as needed (irritation around nose)., Disp: , Rfl:    Melatonin 10 MG TABS, Take 10 mg by mouth at bedtime as needed (sleep)., Disp: , Rfl:    metoprolol succinate (TOPROL-XL) 25 MG 24 hr tablet, Take 25 mg by mouth daily., Disp: , Rfl:    metroNIDAZOLE (FLAGYL) 500 MG tablet, Take 500 mg by mouth 2 (two) times daily. (Patient not taking: Reported on 03/15/2021), Disp: , Rfl:    Multiple  Vitamins-Minerals (MULTIVITAMIN WITH MINERALS) tablet, Take 2 tablets by mouth daily., Disp: , Rfl:    Peppermint Oil (IBGARD) 90 MG CPCR, Take 90 mg by mouth daily., Disp: , Rfl:    PREMARIN vaginal cream, Place 0.5 g vaginally 2 (two) times a week., Disp: , Rfl:    rosuvastatin (CRESTOR) 10 MG tablet, Take 10 mg by mouth daily. (Patient not taking: Reported on 03/15/2021), Disp: , Rfl:    rosuvastatin (CRESTOR) 10 MG tablet, Take 1 tablet by mouth daily., Disp: , Rfl:    solifenacin (VESICARE) 10 MG tablet, Take 10 mg by mouth daily as needed (overactive bladder)., Disp: , Rfl:    traZODone (DESYREL) 100 MG tablet, Take 1 tablet by mouth at bedtime. (Patient not taking: Reported on 03/15/2021), Disp: , Rfl:    traZODone (DESYREL) 50 MG tablet, Take 50 mg by mouth at bedtime., Disp: , Rfl:   Past Medical History: Past Medical History:  Diagnosis Date   Overactive bladder    Venous insufficiency     Tobacco Use: Social History   Tobacco Use  Smoking Status Former   Packs/day: 1.00   Years: 30.00   Pack years: 30.00   Types: Cigarettes   Quit date: 03/03/2016   Years since quitting: 5.0  Smokeless Tobacco Never    Labs: Recent Review Flowsheet Data   There is no flowsheet data to display.  Exercise Target Goals: Exercise Program Goal: Individual exercise prescription set using results from initial 6 min walk test and THRR while considering  patients activity barriers and safety.   Exercise Prescription Goal: Initial exercise prescription builds to 30-45 minutes a day of aerobic activity, 2-3 days per week.  Home exercise guidelines will be given to patient during program as part of exercise prescription that the participant will acknowledge.   Education: Aerobic Exercise: - Group verbal and visual presentation on the components of exercise prescription. Introduces F.I.T.T principle from ACSM for exercise prescriptions.  Reviews F.I.T.T. principles of aerobic exercise  including progression. Written material given at graduation.   Education: Resistance Exercise: - Group verbal and visual presentation on the components of exercise prescription. Introduces F.I.T.T principle from ACSM for exercise prescriptions  Reviews F.I.T.T. principles of resistance exercise including progression. Written material given at graduation.    Education: Exercise & Equipment Safety: - Individual verbal instruction and demonstration of equipment use and safety with use of the equipment. Flowsheet Row Cardiac Rehab from 03/29/2021 in [facility] Cardiac and Pulmonary Rehab  Date 03/29/21  Educator [facility]  Instruction Review Code 1- Verbalizes Understanding       Education: Exercise Physiology & General Exercise Guidelines: - Group verbal and written instruction with models to review the exercise physiology of the cardiovascular system and associated critical values. Provides general exercise guidelines with specific guidelines to those with heart or lung disease.  Flowsheet Row Cardiac Rehab from 03/29/2021 in [facility] Cardiac and Pulmonary Rehab  Education need identified 03/29/21       Education: Flexibility, Balance, Mind/Body Relaxation: - Group verbal and visual presentation with interactive activity on the components of exercise prescription. Introduces F.I.T.T principle from ACSM for exercise prescriptions. Reviews F.I.T.T. principles of flexibility and balance exercise training including progression. Also discusses the mind body connection.  Reviews various relaxation techniques to help reduce and manage stress (i.e. Deep breathing, progressive muscle relaxation, and visualization). Balance handout provided to take home. Written material given at graduation. Flowsheet Row Cardiac Rehab from 03/29/2021 in [facility] Cardiac and Pulmonary Rehab  Education need identified 03/29/21       Activity Barriers & Risk Stratification:   6 Minute Walk:  6 Minute Walk     Row Name 03/29/21 1601          6 Minute Walk   Phase Initial     Distance 1585 feet     Walk Time 6 minutes     # of Rest Breaks 0     MPH 3     METS 3.41     RPE 8     Perceived Dyspnea  0     VO2 Peak 11.95     Symptoms No     Resting HR 74 bpm     Resting BP 110/70     Resting Oxygen Saturation  97 %     Exercise Oxygen Saturation  during 6 min walk 97 %     Max Ex. HR 109 bpm     Max Ex. BP 134/72     2 Minute Post BP 112/84              Oxygen Initial Assessment:   Oxygen Re-Evaluation:   Oxygen Discharge (Final Oxygen Re-Evaluation):   Initial Exercise Prescription:  Initial Exercise Prescription - 03/29/21 1600       Date of Initial Exercise RX and Referring Provider   Date 03/29/21    Referring Provider Corky Sox  Oxygen   Maintain Oxygen Saturation 88% or higher      Treadmill   MPH 3    Grade 1    Minutes 15    METs 3.71      Recumbant Bike   Level 2    RPM 60    Minutes 15    METs 3.41      REL-XR   Level 3    Speed 50    Minutes 15    METs 3.41      T5 Nustep   Level 2    SPM 80    Minutes 15    METs 3.41      Prescription Details   Frequency (times per week) 2    Duration Progress to 30 minutes of continuous aerobic without signs/symptoms of physical distress      Intensity   THRR 40-80% of Max Heartrate 102-130    Ratings of Perceived Exertion 11-13    Perceived Dyspnea 0-4      Progression   Progression Continue to progress workloads to maintain intensity without signs/symptoms of physical distress.      Resistance Training   Training Prescription Yes    Weight 2    Reps 10-15             Perform Capillary Blood Glucose checks as needed.  Exercise Prescription Changes:   Exercise Prescription Changes     Row Name 03/29/21 1600             Response to Exercise   Blood Pressure (Admit) 110/70       Blood Pressure (Exercise) 134/72       Blood Pressure (Exit) 112/84       Heart Rate (Admit) 74 bpm       Heart Rate  (Exercise) 109 bpm       Heart Rate (Exit) 74 bpm       Oxygen Saturation (Admit) 97 %       Oxygen Saturation (Exercise) 97 %       Oxygen Saturation (Exit) 97 %       Rating of Perceived Exertion (Exercise) 8       Perceived Dyspnea (Exercise) 0       Symptoms none       Comments 6 min walk test results                Exercise Comments:   Exercise Goals and Review:   Exercise Goals     Row Name 03/29/21 1608             Exercise Goals   Increase Physical Activity Yes       Intervention Provide advice, education, support and counseling about physical activity/exercise needs.;Develop an individualized exercise prescription for aerobic and resistive training based on initial evaluation findings, risk stratification, comorbidities and participant's personal goals.       Expected Outcomes Short Term: Attend rehab on a regular basis to increase amount of physical activity.;Long Term: Add in home exercise to make exercise part of routine and to increase amount of physical activity.;Long Term: Exercising regularly at least 3-5 days a week.       Increase Strength and Stamina Yes       Intervention Provide advice, education, support and counseling about physical activity/exercise needs.;Develop an individualized exercise prescription for aerobic and resistive training based on initial evaluation findings, risk stratification, comorbidities and participant's personal goals.       Expected Outcomes Short Term: Increase  workloads from initial exercise prescription for resistance, speed, and METs.;Short Term: Perform resistance training exercises routinely during rehab and add in resistance training at home;Long Term: Improve cardiorespiratory fitness, muscular endurance and strength as measured by increased METs and functional capacity (6MWT)       Able to understand and use rate of perceived exertion (RPE) scale Yes       Intervention Provide education and explanation on how to use RPE  scale       Expected Outcomes Short Term: Able to use RPE daily in rehab to express subjective intensity level;Long Term:  Able to use RPE to guide intensity level when exercising independently       Able to understand and use Dyspnea scale Yes       Intervention Provide education and explanation on how to use Dyspnea scale       Expected Outcomes Short Term: Able to use Dyspnea scale daily in rehab to express subjective sense of shortness of breath during exertion;Long Term: Able to use Dyspnea scale to guide intensity level when exercising independently       Knowledge and understanding of Target Heart Rate Range (THRR) Yes       Intervention Provide education and explanation of THRR including how the numbers were predicted and where they are located for reference       Expected Outcomes Short Term: Able to state/look up THRR;Long Term: Able to use THRR to govern intensity when exercising independently;Short Term: Able to use daily as guideline for intensity in rehab       Able to check pulse independently Yes       Intervention Provide education and demonstration on how to check pulse in carotid and radial arteries.;Review the importance of being able to check your own pulse for safety during independent exercise       Expected Outcomes Short Term: Able to explain why pulse checking is important during independent exercise;Long Term: Able to check pulse independently and accurately       Understanding of Exercise Prescription Yes       Intervention Provide education, explanation, and written materials on patient's individual exercise prescription       Expected Outcomes Short Term: Able to explain program exercise prescription;Long Term: Able to explain home exercise prescription to exercise independently                Exercise Goals Re-Evaluation :   Discharge Exercise Prescription (Final Exercise Prescription Changes):  Exercise Prescription Changes - 03/29/21 1600       Response to  Exercise   Blood Pressure (Admit) 110/70    Blood Pressure (Exercise) 134/72    Blood Pressure (Exit) 112/84    Heart Rate (Admit) 74 bpm    Heart Rate (Exercise) 109 bpm    Heart Rate (Exit) 74 bpm    Oxygen Saturation (Admit) 97 %    Oxygen Saturation (Exercise) 97 %    Oxygen Saturation (Exit) 97 %    Rating of Perceived Exertion (Exercise) 8    Perceived Dyspnea (Exercise) 0    Symptoms none    Comments 6 min walk test results             Nutrition:  Target Goals: Understanding of nutrition guidelines, daily intake of sodium 1500mg , cholesterol 200mg , calories 30% from fat and 7% or less from saturated fats, daily to have 5 or more servings of fruits and vegetables.  Education: All About Nutrition: -Group instruction provided by verbal, written material,  interactive activities, discussions, models, and posters to present general guidelines for heart healthy nutrition including fat, fiber, MyPlate, the role of sodium in heart healthy nutrition, utilization of the nutrition label, and utilization of this knowledge for meal planning. Follow up email sent as well. Written material given at graduation. Flowsheet Row Cardiac Rehab from 03/29/2021 in [facility] Cardiac and Pulmonary Rehab  Education need identified 03/29/21       Biometrics:  Pre Biometrics - 03/29/21 1609       Pre Biometrics   Height 5' 5.75" (1.67 m)    Weight 147 lb 6.4 oz (66.9 kg)    BMI (Calculated) 23.97    Single Leg Stand 30 seconds              Nutrition Therapy Plan and Nutrition Goals:  Nutrition Therapy & Goals - 03/29/21 1612       Intervention Plan   Intervention Prescribe, educate and counsel regarding individualized specific dietary modifications aiming towards targeted core components such as weight, hypertension, lipid management, diabetes, heart failure and other comorbidities.    Expected Outcomes Short Term Goal: Understand basic principles of dietary content, such as calories,  fat, sodium, cholesterol and nutrients.;Short Term Goal: A plan has been developed with personal nutrition goals set during dietitian appointment.;Long Term Goal: Adherence to prescribed nutrition plan.             Nutrition Assessments:  MEDIFICTS Score Key: ?70 Need to make dietary changes  40-70 Heart Healthy Diet ? 40 Therapeutic Level Cholesterol Diet  Flowsheet Row Cardiac Rehab from 03/29/2021 in [facility] Cardiac and Pulmonary Rehab  Picture Your Plate Total Score on Admission 58      Picture Your Plate Scores: <10 Unhealthy dietary pattern with much room for improvement. 41-50 Dietary pattern unlikely to meet recommendations for good health and room for improvement. 51-60 More healthful dietary pattern, with some room for improvement.  >60 Healthy dietary pattern, although there may be some specific behaviors that could be improved.    Nutrition Goals Re-Evaluation:   Nutrition Goals Discharge (Final Nutrition Goals Re-Evaluation):   Psychosocial: Target Goals: Acknowledge presence or absence of significant depression and/or stress, maximize coping skills, provide positive support system. Participant is able to verbalize types and ability to use techniques and skills needed for reducing stress and depression.   Education: Stress, Anxiety, and Depression - Group verbal and visual presentation to define topics covered.  Reviews how body is impacted by stress, anxiety, and depression.  Also discusses healthy ways to reduce stress and to treat/manage anxiety and depression.  Written material given at graduation.   Education: Sleep Hygiene -Provides group verbal and written instruction about how sleep can affect your health.  Define sleep hygiene, discuss sleep cycles and impact of sleep habits. Review good sleep hygiene tips.    Initial Review & Psychosocial Screening:  Initial Psych Review & Screening - 03/15/21 1050       Initial Review   Current issues with Current  Sleep Concerns    Comments She is always going and does not slow down and uses some medication to help her sleep. She has a positive outlook on life and does not take life for granted. Patient states that she had a problem and was more short of breath and wanted to learn how to get better. She is looking forward to getting into rehab.      Family Dynamics   Good Support System? Yes    Comments She can look to  her two daughters for support that are in town. They check on her almost everyday.      Barriers   Psychosocial barriers to participate in program The patient should benefit from training in stress management and relaxation.;There are no identifiable barriers or psychosocial needs.      Screening Interventions   Interventions Encouraged to exercise;Provide feedback about the scores to participant;To provide support and resources with identified psychosocial needs    Expected Outcomes Short Term goal: Utilizing psychosocial counselor, staff and physician to assist with identification of specific Stressors or current issues interfering with healing process. Setting desired goal for each stressor or current issue identified.;Long Term Goal: Stressors or current issues are controlled or eliminated.;Short Term goal: Identification and review with participant of any Quality of Life or Depression concerns found by scoring the questionnaire.;Long Term goal: The participant improves quality of Life and PHQ9 Scores as seen by post scores and/or verbalization of changes             Quality of Life Scores:   Quality of Life - 03/29/21 1613       Quality of Life   Select Quality of Life      Quality of Life Scores   Health/Function Pre 27.2 %    Socioeconomic Pre 22.29 %    Psych/Spiritual Pre 27.43 %    Family Pre 24 %    GLOBAL Pre 25.76 %            Scores of 19 and below usually indicate a poorer quality of life in these areas.  A difference of  2-3 points is a clinically  meaningful difference.  A difference of 2-3 points in the total score of the Quality of Life Index has been associated with significant improvement in overall quality of life, self-image, physical symptoms, and general health in studies assessing change in quality of life.  PHQ-9: Recent Review Flowsheet Data     Depression screen [facility] 2/9 03/29/2021   Decreased Interest 0   Down, Depressed, Hopeless 0   PHQ - 2 Score 0   Altered sleeping 0   Tired, decreased energy 0   Change in appetite 0   Feeling bad or failure about yourself  0   Trouble concentrating 0   Moving slowly or fidgety/restless 0   Suicidal thoughts 0   PHQ-9 Score 0   Difficult doing work/chores Not difficult at all      Interpretation of Total Score  Total Score Depression Severity:  1-4 = Minimal depression, 5-9 = Mild depression, 10-14 = Moderate depression, 15-19 = Moderately severe depression, 20-27 = Severe depression   Psychosocial Evaluation and Intervention:  Psychosocial Evaluation - 03/15/21 1053       Psychosocial Evaluation & Interventions   Interventions Encouraged to exercise with the program and follow exercise prescription;Relaxation education;Stress management education    Comments She is always going and does not slow down and uses some medication to help her sleep. She has a positive outlook on life and does not take life for granted. Patient states that she had a problem and was more short of breath and wanted to learn how to get better. She is looking forward to getting into rehab.She can look to her two daughters for support that are in town. They check on her almost everyday.    Expected Outcomes Short: Start [facility] to help with mood. Long: Maintain a healthy mental state.    Continue Psychosocial Services  Follow up required by  staff             Psychosocial Re-Evaluation:   Psychosocial Discharge (Final Psychosocial Re-Evaluation):   Vocational Rehabilitation: Provide  vocational rehab assistance to qualifying candidates.   Vocational Rehab Evaluation & Intervention:   Education: Education Goals: Education classes will be provided on a variety of topics geared toward better understanding of heart health and risk factor modification. Participant will state understanding/return demonstration of topics presented as noted by education test scores.  Learning Barriers/Preferences:  Learning Barriers/Preferences - 03/15/21 1048       Learning Barriers/Preferences   Learning Barriers None    Learning Preferences None             General Cardiac Education Topics:  AED/CPR: - Group verbal and written instruction with the use of models to demonstrate the basic use of the AED with the basic ABC's of resuscitation.   Anatomy and Cardiac Procedures: - Group verbal and visual presentation and models provide information about basic cardiac anatomy and function. Reviews the testing methods done to diagnose heart disease and the outcomes of the test results. Describes the treatment choices: Medical Management, Angioplasty, or Coronary Bypass Surgery for treating various heart conditions including Myocardial Infarction, Angina, Valve Disease, and Cardiac Arrhythmias.  Written material given at graduation. Flowsheet Row Cardiac Rehab from 03/29/2021 in [facility] Cardiac and Pulmonary Rehab  Education need identified 03/29/21       Medication Safety: - Group verbal and visual instruction to review commonly prescribed medications for heart and lung disease. Reviews the medication, class of the drug, and side effects. Includes the steps to properly store meds and maintain the prescription regimen.  Written material given at graduation.   Intimacy: - Group verbal instruction through game format to discuss how heart and lung disease can affect sexual intimacy. Written material given at graduation..   Know Your Numbers and Heart Failure: - Group verbal and visual  instruction to discuss disease risk factors for cardiac and pulmonary disease and treatment options.  Reviews associated critical values for Overweight/Obesity, Hypertension, Cholesterol, and Diabetes.  Discusses basics of heart failure: signs/symptoms and treatments.  Introduces Heart Failure Zone chart for action plan for heart failure.  Written material given at graduation. Flowsheet Row Cardiac Rehab from 03/29/2021 in [facility] Cardiac and Pulmonary Rehab  Education need identified 03/29/21       Infection Prevention: - Provides verbal and written material to individual with discussion of infection control including proper hand washing and proper equipment cleaning during exercise session. Flowsheet Row Cardiac Rehab from 03/29/2021 in [facility] Cardiac and Pulmonary Rehab  Date 03/29/21  Educator [place]  Instruction Review Code 1- Verbalizes Understanding       Falls Prevention: - Provides verbal and written material to individual with discussion of falls prevention and safety. Flowsheet Row Cardiac Rehab from 03/29/2021 in [facility] Cardiac and Pulmonary Rehab  Date 03/29/21  Educator [facility]  Instruction Review Code 1- Verbalizes Understanding       Other: -Provides group and verbal instruction on various topics (see comments)   Knowledge Questionnaire Score:  Knowledge Questionnaire Score - 03/29/21 1613       Knowledge Questionnaire Score   Pre Score 20/26             Core Components/Risk Factors/Patient Goals at Admission:  Personal Goals and Risk Factors at Admission - 03/29/21 1610       Core Components/Risk Factors/Patient Goals on Admission    Weight Management Yes;Weight Maintenance    Intervention Weight  Management: Develop a combined nutrition and exercise program designed to reach desired caloric intake, while maintaining appropriate intake of nutrient and fiber, sodium and fats, and appropriate energy expenditure required for the weight goal.;Weight Management: Provide  education and appropriate resources to help participant work on and attain dietary goals.;Weight Management/Obesity: Establish reasonable short term and long term weight goals.    Admit Weight 147 lb 6.4 oz (66.9 kg)    Goal Weight: Short Term 147 lb 6.4 oz (66.9 kg)    Goal Weight: Long Term 147 lb 6.4 oz (66.9 kg)    Expected Outcomes Short Term: Continue to assess and modify interventions until short term weight is achieved;Long Term: Adherence to nutrition and physical activity/exercise program aimed toward attainment of established weight goal;Weight Maintenance: Understanding of the daily nutrition guidelines, which includes 25-35% calories from fat, 7% or less cal from saturated fats, less than 200mg  cholesterol, less than 1.5gm of sodium, & 5 or more servings of fruits and vegetables daily;Understanding recommendations for meals to include 15-35% energy as protein, 25-35% energy from fat, 35-60% energy from carbohydrates, less than 200mg  of dietary cholesterol, 20-35 gm of total fiber daily;Understanding of distribution of calorie intake throughout the day with the consumption of 4-5 meals/snacks    Lipids Yes    Intervention Provide education and support for participant on nutrition & aerobic/resistive exercise along with prescribed medications to achieve LDL 70mg , HDL >40mg .    Expected Outcomes Short Term: Participant states understanding of desired cholesterol values and is compliant with medications prescribed. Participant is following exercise prescription and nutrition guidelines.;Long Term: Cholesterol controlled with medications as prescribed, with individualized exercise RX and with personalized nutrition plan. Value goals: LDL < 70mg , HDL > 40 mg.             Education:Diabetes - Individual verbal and written instruction to review signs/symptoms of diabetes, desired ranges of glucose level fasting, after meals and with exercise. Acknowledge that pre and post exercise glucose  checks will be done for 3 sessions at entry of program.   Core Components/Risk Factors/Patient Goals Review:    Core Components/Risk Factors/Patient Goals at Discharge (Final Review):    ITP Comments:  ITP Comments     Row Name 03/15/21 1047 03/29/21 1600         ITP Comments Virtual Visit completed. Patient informed on EP and RD appointment and 6 Minute walk test. Patient also informed of patient health questionnaires on My Chart. Patient Verbalizes understanding. Visit diagnosis can be found in [facility] 03/05/2021. Completed 6MWT and gym orientation. Initial ITP created and sent for review to Dr. Emily Filbert, Medical Director.               Comments: initial ITP

## 2021-03-31 ENCOUNTER — Encounter: Payer: Medicare HMO | Attending: Cardiology

## 2021-03-31 ENCOUNTER — Other Ambulatory Visit: Payer: Self-pay

## 2021-03-31 DIAGNOSIS — Z955 Presence of coronary angioplasty implant and graft: Secondary | ICD-10-CM | POA: Diagnosis not present

## 2021-03-31 NOTE — Progress Notes (Signed)
Daily Session Note ? ?Patient Details  ?Name: Kristina Proctor ?MRN: [id number] ?Date of Birth: 07/10/1945 ?Referring Provider:   ?Flowsheet Row Cardiac Rehab from 03/29/2021 in [facility] Cardiac and Pulmonary Rehab  ?Referring Provider Corky Sox  ? ?  ? ? ?Encounter Date: 03/31/2021 ? ?Check In: ? Session Check In - 03/31/21 1339   ? ?  ? Check-In  ? Supervising physician immediately available to respond to emergencies See telemetry face sheet for immediately available ER MD   ? Location [facility]-Cardiac & Pulmonary Rehab   ? Staff Present Birdie Sons, MPA, RN;Joseph [place], RCP,RRT,BSRT;Kara [place], MS, ASCM CEP, Exercise Physiologist   ? Virtual Visit No   ? Medication changes reported     No   ? Fall or balance concerns reported    No   ? Warm-up and Cool-down Performed on first and last piece of equipment   ? Resistance Training Performed Yes   ? VAD Patient? No   ? PAD/SET Patient? No   ?  ? Pain Assessment  ? Currently in Pain? No/denies   ? ?  ?  ? ?  ? ? ? ? ? ?Social History  ? ?Tobacco Use  ?Smoking Status Former  ? Packs/day: 1.00  ? Years: 30.00  ? Pack years: 30.00  ? Types: Cigarettes  ? Quit date: 03/03/2016  ? Years since quitting: 5.0  ?Smokeless Tobacco Never  ? ? ?Goals Met:  ?Independence with exercise equipment ?Exercise tolerated well ?No report of concerns or symptoms today ?Strength training completed today ? ?Goals Unmet:  ?Not Applicable ? ?Comments: First full day of exercise!  Patient was oriented to gym and equipment including functions, settings, policies, and procedures.  Patient's individual exercise prescription and treatment plan were reviewed.  All starting workloads were established based on the results of the 6 minute walk test done at initial orientation visit.  The plan for exercise progression was also introduced and progression will be customized based on patient's performance and goals. ? ? ? ?Dr. Emily Filbert is Medical Director for [place].  ?Dr. Ottie Glazier is  Medical Director for [facility] Pulmonary Rehabilitation. ?

## 2021-04-01 DIAGNOSIS — H524 Presbyopia: Secondary | ICD-10-CM | POA: Diagnosis not present

## 2021-04-05 ENCOUNTER — Other Ambulatory Visit: Payer: Self-pay

## 2021-04-05 DIAGNOSIS — Z955 Presence of coronary angioplasty implant and graft: Secondary | ICD-10-CM

## 2021-04-05 DIAGNOSIS — M5431 Sciatica, right side: Secondary | ICD-10-CM | POA: Diagnosis not present

## 2021-04-05 DIAGNOSIS — M9903 Segmental and somatic dysfunction of lumbar region: Secondary | ICD-10-CM | POA: Diagnosis not present

## 2021-04-05 DIAGNOSIS — M9904 Segmental and somatic dysfunction of sacral region: Secondary | ICD-10-CM | POA: Diagnosis not present

## 2021-04-05 DIAGNOSIS — M9905 Segmental and somatic dysfunction of pelvic region: Secondary | ICD-10-CM | POA: Diagnosis not present

## 2021-04-05 NOTE — Progress Notes (Signed)
Daily Session Note ? ?Patient Details  ?Name: Kristina Proctor ?MRN: [id number] ?Date of Birth: 1945/08/20 ?Referring Provider:   ?Flowsheet Row Cardiac Rehab from 03/29/2021 in [facility] Cardiac and Pulmonary Rehab  ?Referring Provider Corky Sox  ? ?  ? ? ?Encounter Date: 04/05/2021 ? ?Check In: ? Session Check In - 04/05/21 1403   ? ?  ? Check-In  ? Supervising physician immediately available to respond to emergencies See telemetry face sheet for immediately available ER MD   ? Location [facility]-Cardiac & Pulmonary Rehab   ? Staff Present Birdie Sons, MPA, Nino Glow, MS, ASCM CEP, Exercise Physiologist;Joseph [place], [place]   ? Virtual Visit No   ? Medication changes reported     No   ? Fall or balance concerns reported    No   ? Warm-up and Cool-down Performed on first and last piece of equipment   ? Resistance Training Performed Yes   ? VAD Patient? No   ? PAD/SET Patient? No   ?  ? Pain Assessment  ? Currently in Pain? No/denies   ? ?  ?  ? ?  ? ? ? ? ? ?Social History  ? ?Tobacco Use  ?Smoking Status Former  ? Packs/day: 1.00  ? Years: 30.00  ? Pack years: 30.00  ? Types: Cigarettes  ? Quit date: 03/03/2016  ? Years since quitting: 5.0  ?Smokeless Tobacco Never  ? ? ?Goals Met:  ?Independence with exercise equipment ?Exercise tolerated well ?No report of concerns or symptoms today ?Strength training completed today ? ?Goals Unmet:  ?Not Applicable ? ?Comments: Pt able to follow exercise prescription today without complaint.  Will continue to monitor for progression. ? ? ? ?Dr. Emily Filbert is Medical Director for [place].  ?Dr. Ottie Glazier is Medical Director for [facility] Pulmonary Rehabilitation. ?

## 2021-04-05 NOTE — Progress Notes (Signed)
Completed initial RD consultation ?

## 2021-04-07 ENCOUNTER — Other Ambulatory Visit: Payer: Self-pay

## 2021-04-07 DIAGNOSIS — Z955 Presence of coronary angioplasty implant and graft: Secondary | ICD-10-CM | POA: Diagnosis not present

## 2021-04-07 NOTE — Progress Notes (Signed)
Daily Session Note ? ?Patient Details  ?Name: Kristina Proctor ?MRN: [id number] ?Date of Birth: 12/21/1945 ?Referring Provider:   ?Flowsheet Row Cardiac Rehab from 03/29/2021 in [facility] Cardiac and Pulmonary Rehab  ?Referring Provider Corky Sox  ? ?  ? ? ?Encounter Date: 04/07/2021 ? ?Check In: ? Session Check In - 04/07/21 1347   ? ?  ? Check-In  ? Supervising physician immediately available to respond to emergencies See telemetry face sheet for immediately available ER MD   ? Location [facility]-Cardiac & Pulmonary Rehab   ? Staff Present Birdie Sons, MPA, RN;Joseph [place], RCP,RRT,BSRT;Kara [place], MS, ASCM CEP, Exercise Physiologist   ? Virtual Visit No   ? Medication changes reported     No   ? Fall or balance concerns reported    No   ? Warm-up and Cool-down Performed on first and last piece of equipment   ? Resistance Training Performed Yes   ? VAD Patient? No   ? PAD/SET Patient? No   ?  ? Pain Assessment  ? Currently in Pain? No/denies   ? ?  ?  ? ?  ? ? ? ? ? ?Social History  ? ?Tobacco Use  ?Smoking Status Former  ? Packs/day: 1.00  ? Years: 30.00  ? Pack years: 30.00  ? Types: Cigarettes  ? Quit date: 03/03/2016  ? Years since quitting: 5.0  ?Smokeless Tobacco Never  ? ? ?Goals Met:  ?Independence with exercise equipment ?Exercise tolerated well ?No report of concerns or symptoms today ?Strength training completed today ? ?Goals Unmet:  ?Not Applicable ? ?Comments: Pt able to follow exercise prescription today without complaint.  Will continue to monitor for progression. ? ? ? ?Dr. Emily Filbert is Medical Director for [place].  ?Dr. Ottie Glazier is Medical Director for [facility] Pulmonary Rehabilitation. ?

## 2021-04-08 ENCOUNTER — Other Ambulatory Visit: Payer: Self-pay

## 2021-04-08 ENCOUNTER — Encounter: Payer: Medicare HMO | Admitting: *Deleted

## 2021-04-08 DIAGNOSIS — Z955 Presence of coronary angioplasty implant and graft: Secondary | ICD-10-CM

## 2021-04-08 NOTE — Progress Notes (Signed)
Daily Session Note ? ?Patient Details  ?Name: Kristina Proctor ?MRN: [id number] ?Date of Birth: 1945/08/26 ?Referring Provider:   ?Flowsheet Row Cardiac Rehab from 03/29/2021 in [facility] Cardiac and Pulmonary Rehab  ?Referring Provider Corky Sox  ? ?  ? ? ?Encounter Date: 04/08/2021 ? ?Check In: ? Session Check In - 04/08/21 1332   ? ?  ? Check-In  ? Supervising physician immediately available to respond to emergencies See telemetry face sheet for immediately available ER MD   ? Location [facility]-Cardiac & Pulmonary Rehab   ? Staff Present Renita Papa, RN BSN;Joseph [place], RCP,RRT,BSRT;Jessica [place], [place], [place], [place], CCET   ? Virtual Visit No   ? Medication changes reported     No   ? Fall or balance concerns reported    No   ? Warm-up and Cool-down Performed on first and last piece of equipment   ? Resistance Training Performed Yes   ? VAD Patient? No   ? PAD/SET Patient? No   ?  ? Pain Assessment  ? Currently in Pain? No/denies   ? ?  ?  ? ?  ? ? ? ? ? ?Social History  ? ?Tobacco Use  ?Smoking Status Former  ? Packs/day: 1.00  ? Years: 30.00  ? Pack years: 30.00  ? Types: Cigarettes  ? Quit date: 03/03/2016  ? Years since quitting: 5.1  ?Smokeless Tobacco Never  ? ? ?Goals Met:  ?Independence with exercise equipment ?Exercise tolerated well ?No report of concerns or symptoms today ?Strength training completed today ? ?Goals Unmet:  ?Not Applicable ? ?Comments: Pt able to follow exercise prescription today without complaint.  Will continue to monitor for progression. ? ? ? ?Dr. Emily Filbert is Medical Director for [place].  ?Dr. Ottie Glazier is Medical Director for [facility] Pulmonary Rehabilitation. ?

## 2021-04-14 ENCOUNTER — Other Ambulatory Visit: Payer: Self-pay

## 2021-04-14 DIAGNOSIS — Z955 Presence of coronary angioplasty implant and graft: Secondary | ICD-10-CM | POA: Diagnosis not present

## 2021-04-14 NOTE — Progress Notes (Signed)
Daily Session Note ? ?Patient Details  ?Name: Kristina Proctor ?MRN: [id number] ?Date of Birth: 1945-05-02 ?Referring Provider:   ?Flowsheet Row Cardiac Rehab from 03/29/2021 in [facility] Cardiac and Pulmonary Rehab  ?Referring Provider Corky Sox  ? ?  ? ? ?Encounter Date: 04/14/2021 ? ?Check In: ? Session Check In - 04/14/21 1348   ? ?  ? Check-In  ? Supervising physician immediately available to respond to emergencies See telemetry face sheet for immediately available ER MD   ? Location [facility]-Cardiac & Pulmonary Rehab   ? Staff Present Birdie Sons, MPA, RN;Joseph [place], RCP,RRT,BSRT;Melissa Steele, RDN, LDN;Jessica [place], MA, RCEP, CCRP, CCET;Laureen Brown, BS, RRT, CPFT   ? Virtual Visit No   ? Medication changes reported     No   ? Fall or balance concerns reported    No   ? Warm-up and Cool-down Performed on first and last piece of equipment   ? Resistance Training Performed Yes   ? VAD Patient? No   ? PAD/SET Patient? No   ?  ? Pain Assessment  ? Currently in Pain? No/denies   ? ?  ?  ? ?  ? ? ? ? ? ?Social History  ? ?Tobacco Use  ?Smoking Status Former  ? Packs/day: 1.00  ? Years: 30.00  ? Pack years: 30.00  ? Types: Cigarettes  ? Quit date: 03/03/2016  ? Years since quitting: 5.1  ?Smokeless Tobacco Never  ? ? ?Goals Met:  ?Independence with exercise equipment ?Exercise tolerated well ?No report of concerns or symptoms today ?Strength training completed today ? ?Goals Unmet:  ?Not Applicable ? ?Comments: Pt able to follow exercise prescription today without complaint.  Will continue to monitor for progression. ? ? ? ?Dr. Emily Filbert is Medical Director for [place].  ?Dr. Ottie Glazier is Medical Director for [facility] Pulmonary Rehabilitation. ?

## 2021-04-15 DIAGNOSIS — H26493 Other secondary cataract, bilateral: Secondary | ICD-10-CM | POA: Diagnosis not present

## 2021-04-15 DIAGNOSIS — H31002 Unspecified chorioretinal scars, left eye: Secondary | ICD-10-CM | POA: Diagnosis not present

## 2021-04-15 DIAGNOSIS — Z961 Presence of intraocular lens: Secondary | ICD-10-CM | POA: Diagnosis not present

## 2021-04-15 DIAGNOSIS — H43312 Vitreous membranes and strands, left eye: Secondary | ICD-10-CM | POA: Diagnosis not present

## 2021-04-19 ENCOUNTER — Other Ambulatory Visit: Payer: Self-pay

## 2021-04-19 DIAGNOSIS — Z955 Presence of coronary angioplasty implant and graft: Secondary | ICD-10-CM

## 2021-04-19 NOTE — Progress Notes (Signed)
Daily Session Note ? ?Patient Details  ?Name: Kristina Proctor ?MRN: [id number] ?Date of Birth: [birth year] ?Referring Provider:   ?Flowsheet Row Cardiac Rehab from 03/29/2021 in [facility] Cardiac and Pulmonary Rehab  ?Referring Provider Corky Sox  ? ?  ? ? ?Encounter Date: 04/19/2021 ? ?Check In: ? Session Check In - 04/19/21 1336   ? ?  ? Check-In  ? Supervising physician immediately available to respond to emergencies See telemetry face sheet for immediately available ER MD   ? Location [facility]-Cardiac & Pulmonary Rehab   ? Staff Present Birdie Sons, MPA, RN;Joseph [place], RCP,RRT,BSRT;Kara [place], MS, ASCM CEP, Exercise Physiologist   ? Virtual Visit No   ? Medication changes reported     No   ? Fall or balance concerns reported    No   ? Warm-up and Cool-down Performed on first and last piece of equipment   ? Resistance Training Performed Yes   ? VAD Patient? No   ? PAD/SET Patient? No   ?  ? Pain Assessment  ? Currently in Pain? No/denies   ? ?  ?  ? ?  ? ? ? ? ? ?Social History  ? ?Tobacco Use  ?Smoking Status Former  ? Packs/day: 1.00  ? Years: 30.00  ? Pack years: 30.00  ? Types: Cigarettes  ? Quit date: 03/03/2016  ? Years since quitting: 5.1  ?Smokeless Tobacco Never  ? ? ?Goals Met:  ?Independence with exercise equipment ?Exercise tolerated well ?No report of concerns or symptoms today ?Strength training completed today ? ?Goals Unmet:  ?Not Applicable ? ?Comments: Pt able to follow exercise prescription today without complaint.  Will continue to monitor for progression. ? ? ? ?Dr. Emily Filbert is Medical Director for [place].  ?Dr. Ottie Glazier is Medical Director for [facility] Pulmonary Rehabilitation. ?

## 2021-04-21 ENCOUNTER — Encounter: Payer: Self-pay | Admitting: *Deleted

## 2021-04-21 ENCOUNTER — Other Ambulatory Visit: Payer: Self-pay

## 2021-04-21 DIAGNOSIS — Z955 Presence of coronary angioplasty implant and graft: Secondary | ICD-10-CM

## 2021-04-21 DIAGNOSIS — I83893 Varicose veins of bilateral lower extremities with other complications: Secondary | ICD-10-CM | POA: Diagnosis not present

## 2021-04-21 DIAGNOSIS — I2510 Atherosclerotic heart disease of native coronary artery without angina pectoris: Secondary | ICD-10-CM | POA: Diagnosis not present

## 2021-04-21 NOTE — Progress Notes (Signed)
Cardiac Individual Treatment Plan ? ?Patient Details  ?Name: Kristina Proctor ?MRN: [id number] ?Date of Birth: 09-08-1945 ?Referring Provider:   ?Flowsheet Row Cardiac Rehab from 03/29/2021 in [facility] Cardiac and Pulmonary Rehab  ?Referring Provider Corky Sox  ? ?  ? ? ?Initial Encounter Date:  ?Flowsheet Row Cardiac Rehab from 03/29/2021 in [facility] Cardiac and Pulmonary Rehab  ?Date 03/29/21  ? ?  ? ? ?Visit Diagnosis: Status post coronary artery stent placement ? ?Patient's Home Medications on Admission: ? ?Current Outpatient Medications:  ?  aspirin 81 MG chewable tablet, Chew by mouth. (Patient not taking: Reported on 03/15/2021), Disp: , Rfl:  ?  aspirin EC 81 MG tablet, Take 81 mg by mouth daily. Swallow whole., Disp: , Rfl:  ?  baclofen (LIORESAL) 10 MG tablet, Take 10 mg by mouth daily., Disp: , Rfl:  ?  Calcium Carb-Cholecalciferol 500-10 MG-MCG CHEW, Chew 2 each by mouth daily., Disp: , Rfl:  ?  clopidogrel (PLAVIX) 75 MG tablet, Take 1 tablet (75 mg total) by mouth daily., Disp: 90 tablet, Rfl: 3 ?  diclofenac Sodium (VOLTAREN) 1 % GEL, Apply 2 g topically 4 (four) times daily as needed (pain). (Patient not taking: Reported on 03/15/2021), Disp: , Rfl:  ?  diphenhydrAMINE (BENADRYL) 25 MG tablet, Take 25 mg by mouth daily., Disp: , Rfl:  ?  estradiol (ESTRACE) 0.1 MG/GM vaginal cream, Place 1 Applicatorful vaginally once a week., Disp: , Rfl:  ?  fluticasone (FLONASE) 50 MCG/ACT nasal spray, Place 2 sprays into both nostrils daily., Disp: , Rfl:  ?  gentamicin ointment (GARAMYCIN) 0.1 %, Apply 1 application topically 3 (three) times daily as needed (irritation around nose)., Disp: , Rfl:  ?  Melatonin 10 MG TABS, Take 10 mg by mouth at bedtime as needed (sleep)., Disp: , Rfl:  ?  metoprolol succinate (TOPROL-XL) 25 MG 24 hr tablet, Take 25 mg by mouth daily., Disp: , Rfl:  ?  metroNIDAZOLE (FLAGYL) 500 MG tablet, Take 500 mg by mouth 2 (two) times daily. (Patient not taking: Reported on 03/15/2021), Disp: , Rfl:  ?  Multiple  Vitamins-Minerals (MULTIVITAMIN WITH MINERALS) tablet, Take 2 tablets by mouth daily., Disp: , Rfl:  ?  Peppermint Oil (IBGARD) 90 MG CPCR, Take 90 mg by mouth daily., Disp: , Rfl:  ?  PREMARIN vaginal cream, Place 0.5 g vaginally 2 (two) times a week., Disp: , Rfl:  ?  rosuvastatin (CRESTOR) 10 MG tablet, Take 10 mg by mouth daily. (Patient not taking: Reported on 03/15/2021), Disp: , Rfl:  ?  rosuvastatin (CRESTOR) 10 MG tablet, Take 1 tablet by mouth daily., Disp: , Rfl:  ?  solifenacin (VESICARE) 10 MG tablet, Take 10 mg by mouth daily as needed (overactive bladder)., Disp: , Rfl:  ?  traZODone (DESYREL) 100 MG tablet, Take 1 tablet by mouth at bedtime. (Patient not taking: Reported on 03/15/2021), Disp: , Rfl:  ?  traZODone (DESYREL) 50 MG tablet, Take 50 mg by mouth at bedtime., Disp: , Rfl:  ? ?Past Medical History: ?Past Medical History:  ?Diagnosis Date  ? Overactive bladder   ? Venous insufficiency   ? ? ?Tobacco Use: ?Social History  ? ?Tobacco Use  ?Smoking Status Former  ? Packs/day: 1.00  ? Years: 30.00  ? Pack years: 30.00  ? Types: Cigarettes  ? Quit date: 03/03/2016  ? Years since quitting: 5.1  ?Smokeless Tobacco Never  ? ? ?Labs: ?Recent Review Flowsheet Data   ?There is no flowsheet data to display. ?  ? ? ? ?  Exercise Target Goals: ?Exercise Program Goal: ?Individual exercise prescription set using results from initial 6 min walk test and THRR while considering  patient?s activity barriers and safety.  ? ?Exercise Prescription Goal: ?Initial exercise prescription builds to 30-45 minutes a day of aerobic activity, 2-3 days per week.  Home exercise guidelines will be given to patient during program as part of exercise prescription that the participant will acknowledge. ? ? ?Education: Aerobic Exercise: ?- Group verbal and visual presentation on the components of exercise prescription. Introduces F.I.T.T principle from ACSM for exercise prescriptions.  Reviews F.I.T.T. principles of aerobic exercise  including progression. Written material given at graduation. ? ? ?Education: Resistance Exercise: ?- Group verbal and visual presentation on the components of exercise prescription. Introduces F.I.T.T principle from ACSM for exercise prescriptions  Reviews F.I.T.T. principles of resistance exercise including progression. Written material given at graduation. ?Flowsheet Row Cardiac Rehab from 04/14/2021 in [facility] Cardiac and Pulmonary Rehab  ?Date 04/07/21  ?Educator AS  ?Instruction Review Code 1- Verbalizes Understanding  ? ?  ? ?  ?Education: Exercise & Equipment Safety: ?- Individual verbal instruction and demonstration of equipment use and safety with use of the equipment. ?Flowsheet Row Cardiac Rehab from 04/14/2021 in [facility] Cardiac and Pulmonary Rehab  ?Date 03/29/21  ?Educator [place]  ?Instruction Review Code 1- Verbalizes Understanding  ? ?  ? ? ?Education: Exercise Physiology & General Exercise Guidelines: ?- Group verbal and written instruction with models to review the exercise physiology of the cardiovascular system and associated critical values. Provides general exercise guidelines with specific guidelines to those with heart or lung disease.  ?Flowsheet Row Cardiac Rehab from 04/14/2021 in [facility] Cardiac and Pulmonary Rehab  ?Education need identified 03/29/21  ? ?  ? ? ?Education: Flexibility, Balance, Mind/Body Relaxation: ?- Group verbal and visual presentation with interactive activity on the components of exercise prescription. Introduces F.I.T.T principle from ACSM for exercise prescriptions. Reviews F.I.T.T. principles of flexibility and balance exercise training including progression. Also discusses the mind body connection.  Reviews various relaxation techniques to help reduce and manage stress (i.e. Deep breathing, progressive muscle relaxation, and visualization). Balance handout provided to take home. Written material given at graduation. ?Flowsheet Row Cardiac Rehab from 04/14/2021 in [facility] Cardiac and  Pulmonary Rehab  ?Education need identified 03/29/21  ? ?  ? ? ?Activity Barriers & Risk Stratification: ? ? ?6 Minute Walk: ? 6 Minute Walk   ? ? [place] Name 03/29/21 1601  ?  ?  ?  ? 6 Minute Walk  ? Phase Initial    ? Distance 1585 feet    ? Walk Time 6 minutes    ? # of Rest Breaks 0    ? MPH 3    ? METS 3.41    ? RPE 8    ? Perceived Dyspnea  0    ? VO2 Peak 11.95    ? Symptoms No    ? Resting HR 74 bpm    ? Resting BP 110/70    ? Resting Oxygen Saturation  97 %    ? Exercise Oxygen Saturation  during 6 min walk 97 %    ? Max Ex. HR 109 bpm    ? Max Ex. BP 134/72    ? 2 Minute Post BP 112/84    ? ?  ?  ? ?  ? ? ?Oxygen Initial Assessment: ? ? ?Oxygen Re-Evaluation: ? ? ?Oxygen Discharge (Final Oxygen Re-Evaluation): ? ? ?Initial Exercise Prescription: ? Initial Exercise Prescription -  03/29/21 1600   ? ?  ? Date of Initial Exercise RX and Referring Provider  ? Date 03/29/21   ? Referring Provider Corky Sox   ?  ? Oxygen  ? Maintain Oxygen Saturation 88% or higher   ?  ? Treadmill  ? MPH 3   ? Grade 1   ? Minutes 15   ? METs 3.71   ?  ? Recumbant Bike  ? Level 2   ? RPM 60   ? Minutes 15   ? METs 3.41   ?  ? REL-XR  ? Level 3   ? Speed 50   ? Minutes 15   ? METs 3.41   ?  ? T5 Nustep  ? Level 2   ? SPM 80   ? Minutes 15   ? METs 3.41   ?  ? Prescription Details  ? Frequency (times per week) 2   ? Duration Progress to 30 minutes of continuous aerobic without signs/symptoms of physical distress   ?  ? Intensity  ? THRR 40-80% of Max Heartrate 102-130   ? Ratings of Perceived Exertion 11-13   ? Perceived Dyspnea 0-4   ?  ? Progression  ? Progression Continue to progress workloads to maintain intensity without signs/symptoms of physical distress.   ?  ? Resistance Training  ? Training Prescription Yes   ? Weight 2   ? Reps 10-15   ? ?  ?  ? ?  ? ? ?Perform Capillary Blood Glucose checks as needed. ? ?Exercise Prescription Changes: ? ? Exercise Prescription Changes   ? ? [place] Name 03/29/21 1600 04/19/21 1500  ?  ?  ?  ?  ?  Response to Exercise  ? Blood Pressure (Admit) 110/70 124/64     ? Blood Pressure (Exercise) 134/72 124/68     ? Blood Pressure (Exit) 112/84 110/58     ? Heart Rate (Admit) 74 bpm 70 bpm     ? Heart Rat

## 2021-04-21 NOTE — Progress Notes (Signed)
Daily Session Note ? ?Patient Details  ?Name: Javionna Leder ?MRN: [id number] ?Date of Birth: [birth year] ?Referring Provider:   ?Flowsheet Row Cardiac Rehab from 03/29/2021 in [facility] Cardiac and Pulmonary Rehab  ?Referring Provider Corky Sox  ? ?  ? ? ?Encounter Date: 04/21/2021 ? ?Check In: ? Session Check In - 04/21/21 1613   ? ?  ? Check-In  ? Supervising physician immediately available to respond to emergencies See telemetry face sheet for immediately available ER MD   ? Location [facility]-Cardiac & Pulmonary Rehab   ? Staff Present Birdie Sons, MPA, RN;Joseph [place], RCP,RRT,BSRT;Kara [place], MS, ASCM CEP, Exercise Physiologist   ? Virtual Visit No   ? Medication changes reported     No   ? Fall or balance concerns reported    No   ? Warm-up and Cool-down Performed on first and last piece of equipment   ? Resistance Training Performed Yes   ? VAD Patient? No   ? PAD/SET Patient? No   ?  ? Pain Assessment  ? Currently in Pain? No/denies   ? ?  ?  ? ?  ? ? ? ? ? ?Social History  ? ?Tobacco Use  ?Smoking Status Former  ? Packs/day: 1.00  ? Years: 30.00  ? Pack years: 30.00  ? Types: Cigarettes  ? Quit date: 03/03/2016  ? Years since quitting: 5.1  ?Smokeless Tobacco Never  ? ? ?Goals Met:  ?Independence with exercise equipment ?Exercise tolerated well ?No report of concerns or symptoms today ?Strength training completed today ? ?Goals Unmet:  ?Not Applicable ? ?Comments: Pt able to follow exercise prescription today without complaint.  Will continue to monitor for progression. ? ? ? ?Dr. Emily Filbert is Medical Director for [place].  ?Dr. Ottie Glazier is Medical Director for [facility] Pulmonary Rehabilitation. ?

## 2021-04-22 ENCOUNTER — Encounter: Payer: Medicare HMO | Admitting: *Deleted

## 2021-04-22 ENCOUNTER — Other Ambulatory Visit: Payer: Self-pay

## 2021-04-22 DIAGNOSIS — Z955 Presence of coronary angioplasty implant and graft: Secondary | ICD-10-CM

## 2021-04-22 NOTE — Progress Notes (Signed)
Daily Session Note ? ?Patient Details  ?Name: Kristina Proctor ?MRN: [id number] ?Date of Birth: 03-Apr-1945 ?Referring Provider:   ?Flowsheet Row Cardiac Rehab from 03/29/2021 in [facility] Cardiac and Pulmonary Rehab  ?Referring Provider Corky Sox  ? ?  ? ? ?Encounter Date: 04/22/2021 ? ?Check In: ? Session Check In - 04/22/21 1328   ? ?  ? Check-In  ? Supervising physician immediately available to respond to emergencies See telemetry face sheet for immediately available ER MD   ? Location [facility]-Cardiac & Pulmonary Rehab   ? Staff Present Renita Papa, RN BSN;Joseph Leslie, RCP,RRT,BSRT;Jessica [place], [place], [place], [place], CCET   ? Virtual Visit No   ? Medication changes reported     No   ? Fall or balance concerns reported    No   ? Warm-up and Cool-down Performed on first and last piece of equipment   ? Resistance Training Performed Yes   ? VAD Patient? No   ? PAD/SET Patient? No   ?  ? Pain Assessment  ? Currently in Pain? No/denies   ? ?  ?  ? ?  ? ? ? ? ? ?Social History  ? ?Tobacco Use  ?Smoking Status Former  ? Packs/day: 1.00  ? Years: 30.00  ? Pack years: 30.00  ? Types: Cigarettes  ? Quit date: 03/03/2016  ? Years since quitting: 5.1  ?Smokeless Tobacco Never  ? ? ?Goals Met:  ?Independence with exercise equipment ?Exercise tolerated well ?No report of concerns or symptoms today ?Strength training completed today ? ?Goals Unmet:  ?Not Applicable ? ?Comments: Pt able to follow exercise prescription today without complaint.  Will continue to monitor for progression. ? ?Reviewed home exercise with pt today.  Pt plans to walk and use staff videos at home  for exercise.  Reviewed THR, pulse, RPE, sign and symptoms, pulse oximetery and when to call 911 or MD.  Also discussed weather considerations and indoor options.  Pt voiced understanding. ? ? ?Dr. Emily Filbert is Medical Director for [place].  ?Dr. Ottie Glazier is Medical Director for [facility] Pulmonary Rehabilitation. ?

## 2021-04-26 ENCOUNTER — Other Ambulatory Visit: Payer: Self-pay

## 2021-04-26 DIAGNOSIS — Z955 Presence of coronary angioplasty implant and graft: Secondary | ICD-10-CM | POA: Diagnosis not present

## 2021-04-26 NOTE — Progress Notes (Signed)
Daily Session Note ? ?Patient Details  ?Name: Vaneza Baumgartner ?MRN: [id number] ?Date of Birth: 02/24/1945 ?Referring Provider:   ?Flowsheet Row Cardiac Rehab from 03/29/2021 in [facility] Cardiac and Pulmonary Rehab  ?Referring Provider [place]  ? ?  ? ? ?Encounter Date: 04/26/2021 ? ?Check In: ? Session Check In - 04/26/21 1336   ? ?  ? Check-In  ? Supervising physician immediately available to respond to emergencies See telemetry face sheet for immediately available ER MD   ? Location [facility]Card[facility] & Pulmonary Rehab   ? Staff Present Kelly Bollinger, MPA, RN;Kara Langdon, MS, ASCM CEP, Exercise Physiologist;Joseph Hood, RCP,RRT,BSRT   ? Virtual Visit No   ? Medication changes reported     No   ? Fall or balance concerns reported    No   ? Warm-up and Cool-down Performed on first and last piece of equipment   ? Resistance Training Performed Yes   ? VAD Patient? No   ? PAD/SET Patient? No   ?  ? Pain Assessment  ? Currently in Pain? No/denies   ? ?  ?  ? ?  ? ? ? ? ? ?Social History  ? ?Tobacco Use  ?Smoking Status Former  ? Packs/day: 1.00  ? Years: 30.00  ? Pack years: 30.00  ? Types: Cigarettes  ? Quit date: 03/03/2016  ? Years since quitting: 5.1  ?Smokeless Tobacco Never  ? ? ?Goals Met:  ?Independence with exercise equipment ?Exercise tolerated well ?No report of concerns or symptoms today ?Strength training completed today ? ?Goals Unmet:  ?Not Applicable ? ?Comments: Pt able to follow exercise prescription today without complaint.  Will continue to monitor for progression. ? ? ? ?Dr. Mark Miller is Medical Director for [facility] Cardiac [facility].  ?Dr. Fuad Aleskerov is Medical Director for [facility] Pulmonary Rehabilitation. ?

## 2021-04-27 DIAGNOSIS — G8929 Other chronic pain: Secondary | ICD-10-CM | POA: Diagnosis not present

## 2021-04-27 DIAGNOSIS — M5441 Lumbago with sciatica, right side: Secondary | ICD-10-CM | POA: Diagnosis not present

## 2021-04-27 DIAGNOSIS — R300 Dysuria: Secondary | ICD-10-CM | POA: Diagnosis not present

## 2021-04-27 DIAGNOSIS — R350 Frequency of micturition: Secondary | ICD-10-CM | POA: Diagnosis not present

## 2021-04-27 DIAGNOSIS — K219 Gastro-esophageal reflux disease without esophagitis: Secondary | ICD-10-CM | POA: Diagnosis not present

## 2021-04-28 DIAGNOSIS — Z955 Presence of coronary angioplasty implant and graft: Secondary | ICD-10-CM | POA: Diagnosis not present

## 2021-04-28 NOTE — Progress Notes (Signed)
Daily Session Note ? ?Patient Details  ?Name: Kristina Proctor ?MRN: [id number] ?Date of Birth: 12/18/1945 ?Referring Provider:   ?Flowsheet Row Cardiac Rehab from 03/29/2021 in [facility] Cardiac and Pulmonary Rehab  ?Referring Provider Corky Sox  ? ?  ? ? ?Encounter Date: 04/28/2021 ? ?Check In: ? Session Check In - 04/28/21 1335   ? ?  ? Check-In  ? Supervising physician immediately available to respond to emergencies See telemetry face sheet for immediately available ER MD   ? Location [facility]-Cardiac & Pulmonary Rehab   ? Staff Present Birdie Sons, MPA, RN;Joseph [place], RCP,RRT,BSRT;Kara Euless, MS, ASCM CEP, Exercise Physiologist   ? Virtual Visit No   ? Medication changes reported     No   ? Fall or balance concerns reported    No   ? Warm-up and Cool-down Performed on first and last piece of equipment   ? Resistance Training Performed Yes   ? VAD Patient? No   ? PAD/SET Patient? No   ?  ? Pain Assessment  ? Currently in Pain? No/denies   ? ?  ?  ? ?  ? ? ? ? ? ?Social History  ? ?Tobacco Use  ?Smoking Status Former  ? Packs/day: 1.00  ? Years: 30.00  ? Pack years: 30.00  ? Types: Cigarettes  ? Quit date: 03/03/2016  ? Years since quitting: 5.1  ?Smokeless Tobacco Never  ? ? ?Goals Met:  ?Independence with exercise equipment ?Exercise tolerated well ?No report of concerns or symptoms today ?Strength training completed today ? ?Goals Unmet:  ?Not Applicable ? ?Comments: Pt able to follow exercise prescription today without complaint.  Will continue to monitor for progression. ? ? ? ?Dr. Emily Filbert is Medical Director for [place].  ?Dr. Ottie Glazier is Medical Director for [facility] Pulmonary Rehabilitation. ?

## 2021-04-29 ENCOUNTER — Encounter: Payer: Medicare HMO | Admitting: *Deleted

## 2021-04-29 DIAGNOSIS — Z955 Presence of coronary angioplasty implant and graft: Secondary | ICD-10-CM | POA: Diagnosis not present

## 2021-04-29 NOTE — Progress Notes (Signed)
Daily Session Note ? ?Patient Details  ?Name: Kristina Proctor ?MRN: [id number] ?Date of Birth: 1945/09/27 ?Referring Provider:   ?Flowsheet Row Cardiac Rehab from 03/29/2021 in [facility] Cardiac and Pulmonary Rehab  ?Referring Provider Corky Sox  ? ?  ? ? ?Encounter Date: 04/29/2021 ? ?Check In: ? Session Check In - 04/29/21 1328   ? ?  ? Check-In  ? Supervising physician immediately available to respond to emergencies See telemetry face sheet for immediately available ER MD   ? Location [facility]Card[facility] & Pulmonary Rehab   ? Staff Present Renita Papa, RN BSN;Joseph [place], RCP,RRT,BSRT;Jessica [place], [place], [place], [place], CCET   ? Virtual Visit No   ? Medication changes reported     No   ? Fall or balance concerns reported    No   ? Warm-up and Cool-down Performed on first and last piece of equipment   ? Resistance Training Performed Yes   ? VAD Patient? No   ? PAD/SET Patient? No   ?  ? Pain Assessment  ? Currently in Pain? No/denies   ? ?  ?  ? ?  ? ? ? ? ? ?Social History  ? ?Tobacco Use  ?Smoking Status Former  ? Packs/day: 1.00  ? Years: 30.00  ? Pack years: 30.00  ? Types: Cigarettes  ? Quit date: 03/03/2016  ? Years since quitting: 5.1  ?Smokeless Tobacco Never  ? ? ?Goals Met:  ?Independence with exercise equipment ?Exercise tolerated well ?No report of concerns or symptoms today ?Strength training completed today ? ?Goals Unmet:  ?Not Applicable ? ?Comments: Pt able to follow exercise prescription today without complaint.  Will continue to monitor for progression. ? ? ? ?Dr. Emily Filbert is Medical Director for [place].  ?Dr. Ottie Glazier is Medical Director for [facility] Pulmonary Rehabilitation. ?

## 2021-05-03 ENCOUNTER — Encounter: Payer: Medicare HMO | Attending: Cardiology

## 2021-05-03 DIAGNOSIS — I208 Other forms of angina pectoris: Secondary | ICD-10-CM | POA: Diagnosis not present

## 2021-05-03 DIAGNOSIS — Z955 Presence of coronary angioplasty implant and graft: Secondary | ICD-10-CM | POA: Insufficient documentation

## 2021-05-03 DIAGNOSIS — Z48812 Encounter for surgical aftercare following surgery on the circulatory system: Secondary | ICD-10-CM | POA: Diagnosis not present

## 2021-05-03 NOTE — Progress Notes (Signed)
Daily Session Note ? ?Patient Details  ?Name: Jaylon Grode ?MRN: [id number] ?Date of Birth: 05-Feb-1945 ?Referring Provider:   ?Flowsheet Row Cardiac Rehab from 03/29/2021 in [facility] Cardiac and Pulmonary Rehab  ?Referring Provider Corky Sox  ? ?  ? ? ?Encounter Date: 05/03/2021 ? ?Check In: ? Session Check In - 05/03/21 1344   ? ?  ? Check-In  ? Supervising physician immediately available to respond to emergencies See telemetry face sheet for immediately available ER MD   ? Location [facility]-Cardiac & Pulmonary Rehab   ? Staff Present Birdie Sons, MPA, Nino Glow, MS, ASCM CEP, Exercise Physiologist;Joseph [place], [place]   ? Virtual Visit No   ? Medication changes reported     No   ? Fall or balance concerns reported    No   ? Warm-up and Cool-down Performed on first and last piece of equipment   ? Resistance Training Performed Yes   ? VAD Patient? No   ? PAD/SET Patient? No   ?  ? Pain Assessment  ? Currently in Pain? No/denies   ? ?  ?  ? ?  ? ? ? ? ? ?Social History  ? ?Tobacco Use  ?Smoking Status Former  ? Packs/day: 1.00  ? Years: 30.00  ? Pack years: 30.00  ? Types: Cigarettes  ? Quit date: 03/03/2016  ? Years since quitting: 5.1  ?Smokeless Tobacco Never  ? ? ?Goals Met:  ?Independence with exercise equipment ?Exercise tolerated well ?No report of concerns or symptoms today ?Strength training completed today ? ?Goals Unmet:  ?Not Applicable ? ?Comments: Pt able to follow exercise prescription today without complaint.  Will continue to monitor for progression. ? ? ? ?Dr. Emily Filbert is Medical Director for [place].  ?Dr. Ottie Glazier is Medical Director for [facility] Pulmonary Rehabilitation. ?

## 2021-05-05 ENCOUNTER — Telehealth: Payer: Self-pay | Admitting: Urology

## 2021-05-05 NOTE — Telephone Encounter (Signed)
Left message advised we will see her tomorrow.  ?

## 2021-05-05 NOTE — Telephone Encounter (Signed)
Pt is concerned about blood in her urine.  She has appt tomorrow afternoon with [facility] and called to see if she could be seen sooner.  I told her there were no appts available.  She is worried about blood in her urine. ?

## 2021-05-06 ENCOUNTER — Ambulatory Visit: Payer: Medicare HMO | Admitting: Urology

## 2021-05-06 ENCOUNTER — Encounter: Payer: Medicare HMO | Admitting: *Deleted

## 2021-05-06 VITALS — BP 133/70 | HR 76 | Ht 65.0 in | Wt 151.0 lb

## 2021-05-06 DIAGNOSIS — I208 Other forms of angina pectoris: Secondary | ICD-10-CM | POA: Diagnosis not present

## 2021-05-06 DIAGNOSIS — Z48812 Encounter for surgical aftercare following surgery on the circulatory system: Secondary | ICD-10-CM | POA: Diagnosis not present

## 2021-05-06 DIAGNOSIS — N3281 Overactive bladder: Secondary | ICD-10-CM | POA: Diagnosis not present

## 2021-05-06 DIAGNOSIS — Z955 Presence of coronary angioplasty implant and graft: Secondary | ICD-10-CM

## 2021-05-06 DIAGNOSIS — N3941 Urge incontinence: Secondary | ICD-10-CM | POA: Diagnosis not present

## 2021-05-06 DIAGNOSIS — R310 Gross hematuria: Secondary | ICD-10-CM

## 2021-05-06 LAB — MICROSCOPIC EXAMINATION: Epithelial Cells (non renal): >10 /hpf — ABNORMAL HIGH (ref 0–10)

## 2021-05-06 LAB — URINALYSIS, COMPLETE
Bilirubin, UA: NEGATIVE
Glucose, UA: NEGATIVE
Ketones, UA: NEGATIVE
Leukocytes,UA: NEGATIVE
Nitrite, UA: NEGATIVE
Protein,UA: NEGATIVE
Specific Gravity, UA: 1.015 (ref 1.005–1.030)
Urobilinogen, Ur: 0.2 mg/dL (ref 0.2–1.0)
pH, UA: 5.5 (ref 5.0–7.5)

## 2021-05-06 LAB — BLADDER SCAN AMB NON-IMAGING: Scan Result: 0

## 2021-05-06 MED ORDER — GEMTESA 75 MG PO TABS: 75.0000 mg | ORAL_TABLET | Freq: Every day | ORAL | 0 refills | Status: DC

## 2021-05-06 NOTE — Progress Notes (Signed)
Daily Session Note ? ?Patient Details  ?Name: Kristina Proctor ?MRN: [id number] ?Date of Birth: [birth year] ?Referring Provider:   ?Flowsheet Row Cardiac Rehab from 03/29/2021 in [facility] Cardiac and Pulmonary Rehab  ?Referring Provider Corky Sox  ? ?  ? ? ?Encounter Date: 05/06/2021 ? ?Check In: ? Session Check In - 05/06/21 1606   ? ?  ? Check-In  ? Supervising physician immediately available to respond to emergencies See telemetry face sheet for immediately available ER MD   ? Location [facility]-Cardiac & Pulmonary Rehab   ? Staff Present Renita Papa, RN BSN;Joseph [place], RCP,RRT,BSRT;Jessica [place], [place], [place], [place], CCET   ? Virtual Visit No   ? Medication changes reported     No   ? Fall or balance concerns reported    No   ? Warm-up and Cool-down Performed on first and last piece of equipment   ? Resistance Training Performed Yes   ? VAD Patient? No   ? PAD/SET Patient? No   ?  ? Pain Assessment  ? Currently in Pain? No/denies   ? ?  ?  ? ?  ? ? ? ? ? ?Social History  ? ?Tobacco Use  ?Smoking Status Former  ? Packs/day: 1.00  ? Years: 30.00  ? Pack years: 30.00  ? Types: Cigarettes  ? Quit date: 03/03/2016  ? Years since quitting: 5.1  ?Smokeless Tobacco Never  ? ? ?Goals Met:  ?Independence with exercise equipment ?Exercise tolerated well ?No report of concerns or symptoms today ?Strength training completed today ? ?Goals Unmet:  ?Not Applicable ? ?Comments: Pt able to follow exercise prescription today without complaint.  Will continue to monitor for progression. ? ? ? ?Dr. Emily Filbert is Medical Director for [place].  ?Dr. Ottie Glazier is Medical Director for [facility] Pulmonary Rehabilitation. ?

## 2021-05-08 ENCOUNTER — Encounter: Payer: Self-pay | Admitting: Urology

## 2021-05-08 NOTE — Progress Notes (Signed)
? ?05/06/2021 ?12:02 PM  ? ?Kristina Proctor ?06/01/1945 ?[id number] ? ?Referring provider: Donnamarie Rossetti, PA-C ?[place] ?[place],  Kristina Proctor [id number] ? ?Chief Complaint  ?Patient presents with  ? Urinary Incontinence  ? ? ?HPI: ?76 y.o. female called for an acute visit for worsening voiding symptoms and urinary incontinence. ? ?Referred 08/26/2020 for overactive bladder ?Refer to my previous note for details ?Had been on Solifenacin though had dry mouth and constipation.  She requested an alternative medication.  Beta 3 agonist were not on her formulary and Rx oxybutynin was sent as she desired to go through her regular pharmacy and not a good Rx discount pharmacy.  Rx oxybutynin was sent but she also had similar anticholinergic side effects ?Saw PCP 04/27/2021 with right low back pain with sciatica and worsening voiding symptoms including frequency, urgency, urge incontinence ?She was started on a prednisone taper ? ? ?PMH: ?Past Medical History:  ?Diagnosis Date  ? Overactive bladder   ? Venous insufficiency   ? ? ?Surgical History: ?Past Surgical History:  ?Procedure Laterality Date  ? APPENDECTOMY    ? CHOLECYSTECTOMY    ? CORONARY STENT INTERVENTION N/A 03/05/2021  ? Procedure: CORONARY STENT INTERVENTION;  Surgeon: Andrez Grime, MD;  Location: [place] [facility];  Service: Cardiovascular;  Laterality: N/A;  ? LAPAROSCOPIC VAGINAL HYSTERECTOMY    ? LEFT HEART CATH AND CORONARY ANGIOGRAPHY N/A 03/05/2021  ? Procedure: LEFT HEART CATH AND CORONARY ANGIOGRAPHY;  Surgeon: Andrez Grime, MD;  Location: [place] [facility];  Service: Cardiovascular;  Laterality: N/A;  ? ? ?Home Medications:  ?Allergies as of 05/06/2021   ? ?   Reactions  ? Codeine Hives, Palpitations, Other (See Comments)  ? Rapid heart rate ?Rapid heart rate ?Rapid heart rate ?Rapid heart rate ?Rapid heart rate ?Rapid heart rate ?Rapid heart rate ?Rapid heart rate ?Rapid heart rate ?Rapid heart rate  ? Hydrocodone Palpitations,  Other (See Comments)  ? Rapid heart rate ?Rapid heart rate ?Rapid heart rate ?Rapid heart rate  ? Oxycodone Hives, Palpitations, Other (See Comments)  ? Rapid heart rate ?Other reaction(s): Chest Pain ?Other reaction(s): Chest Pain ?Rapid heart rate ?Rapid heart rate ?Rapid heart rate ?Rapid heart rate ?Rapid heart rate ?Rapid heart rate ?Other reaction(s): Chest Pain ?Rapid heart rate ?Rapid heart rate ?Rapid heart rate ?Other reaction(s): Chest Pain ?Rapid heart rate ?Rapid heart rate ?Rapid heart rate ?Rapid heart rate ?Rapid heart rate  ? Oxycodone Hcl Other (See Comments)  ? Rapid heart rate  ? Pravastatin Itching  ? And diarrhea  ? Amitriptyline Nausea Only, Other (See Comments)  ? Out of focus ?Out of focus ?Out of focus ?Out of focus ?Out of focus ?Out of focus ?Out of focus ?Out of focus ?Out of focus ?Out of focus  ? Cephalexin Nausea And Vomiting, Rash, Nausea Only  ? Levofloxacin Rash  ? Bupropion Nausea Only  ? Doxycycline Nausea Only  ? Ciprofloxacin Diarrhea  ? ?  ? ?  ?Medication List  ?  ? ?  ? Accurate as of May 06, 2021 11:59 PM. If you have any questions, ask your nurse or doctor.  ?  ?  ? ?  ? ?aspirin EC 81 MG tablet ?Take 81 mg by mouth daily. Swallow whole. ?  ?aspirin 81 MG chewable tablet ?Chew by mouth. ?  ?baclofen 10 MG tablet ?Commonly known as: LIORESAL ?Take 10 mg by mouth daily. ?  ?Calcium Carb-Cholecalciferol 500-10 MG-MCG Chew ?Chew 2 each by mouth  daily. ?  ?clopidogrel 75 MG tablet ?Commonly known as: Plavix ?Take 1 tablet (75 mg total) by mouth daily. ?  ?diclofenac Sodium 1 % Gel ?Commonly known as: VOLTAREN ?Apply 2 g topically 4 (four) times daily as needed (pain). ?  ?diphenhydrAMINE 25 MG tablet ?Commonly known as: BENADRYL ?Take 25 mg by mouth daily. ?  ?estradiol 0.1 MG/GM vaginal cream ?Commonly known as: ESTRACE ?Place 1 Applicatorful vaginally once a week. ?  ?fluticasone 50 MCG/ACT nasal spray ?Commonly known as: FLONASE ?Place 2 sprays into both nostrils daily. ?   ?Gemtesa 75 MG Tabs ?Generic drug: Vibegron ?Take 75 mg by mouth daily. ?Started by: Abbie Sons, MD ?  ?gentamicin ointment 0.1 % ?Commonly known as: GARAMYCIN ?Apply 1 application topically 3 (three) times daily as needed (irritation around nose). ?  ?IBgard 90 MG Cpcr ?Generic drug: Peppermint Oil ?Take 90 mg by mouth daily. ?  ?Melatonin 10 MG Tabs ?Take 10 mg by mouth at bedtime as needed (sleep). ?  ?metoprolol succinate 25 MG 24 hr tablet ?Commonly known as: TOPROL-XL ?Take 25 mg by mouth daily. ?  ?metroNIDAZOLE 500 MG tablet ?Commonly known as: FLAGYL ?Take 500 mg by mouth 2 (two) times daily. ?  ?multivitamin with minerals tablet ?Take 2 tablets by mouth daily. ?  ?Premarin vaginal cream ?Generic drug: conjugated estrogens ?Place 0.5 g vaginally 2 (two) times a week. ?  ?rosuvastatin 10 MG tablet ?Commonly known as: CRESTOR ?Take 10 mg by mouth daily. ?  ?rosuvastatin 10 MG tablet ?Commonly known as: CRESTOR ?Take 1 tablet by mouth daily. ?  ?solifenacin 10 MG tablet ?Commonly known as: VESICARE ?Take 10 mg by mouth daily as needed (overactive bladder). ?  ?traZODone 100 MG tablet ?Commonly known as: DESYREL ?Take 1 tablet by mouth at bedtime. ?  ?traZODone 50 MG tablet ?Commonly known as: DESYREL ?Take 50 mg by mouth at bedtime. ?  ? ?  ? ? ?Allergies:  ?Allergies  ?Allergen Reactions  ? Codeine Hives, Palpitations and Other (See Comments)  ?  Rapid heart rate ?Rapid heart rate ?Rapid heart rate ?Rapid heart rate ? ?Rapid heart rate ?Rapid heart rate ?Rapid heart rate ?Rapid heart rate ?Rapid heart rate ?Rapid heart rate  ? Hydrocodone Palpitations and Other (See Comments)  ?  Rapid heart rate ? ? ?Rapid heart rate ?Rapid heart rate ?Rapid heart rate  ? Oxycodone Hives, Palpitations and Other (See Comments)  ?  Rapid heart rate ?Other reaction(s): Chest Pain ?Other reaction(s): Chest Pain ?Rapid heart rate ?Rapid heart rate ?Rapid heart rate ?Rapid heart rate ?Rapid heart rate ? ?Rapid heart  rate ?Other reaction(s): Chest Pain ?Rapid heart rate ?Rapid heart rate ?Rapid heart rate ?Other reaction(s): Chest Pain ?Rapid heart rate ?Rapid heart rate ?Rapid heart rate ?Rapid heart rate ?Rapid heart rate  ? Oxycodone Hcl Other (See Comments)  ?  Rapid heart rate  ? Pravastatin Itching  ?  And diarrhea  ? Amitriptyline Nausea Only and Other (See Comments)  ?  Out of focus ?Out of focus ?Out of focus ?Out of focus ? ?Out of focus ?Out of focus ?Out of focus ?Out of focus ?Out of focus ?Out of focus  ? Cephalexin Nausea And Vomiting, Rash and Nausea Only  ? Levofloxacin Rash  ? Bupropion Nausea Only  ? Doxycycline Nausea Only  ? Ciprofloxacin Diarrhea  ? ? ?Family History: ?Family History  ?Problem Relation Age of Onset  ? Breast cancer Maternal Aunt   ? Breast cancer Paternal Aunt   ? ? ?  Social History:  reports that she quit smoking about 5 years ago. Her smoking use included cigarettes. She has a 30.00 pack-year smoking history. She has never used smokeless tobacco. She reports that she does not drink alcohol and does not use drugs. ? ? ?Physical Exam: ?BP 133/70   Pulse 76   Ht '5\' 5"'$  (1.651 m)   Wt 151 lb (68.5 kg)   BMI 25.13 kg/m?   ?Constitutional:  Alert and oriented, No acute distress. ?HEENT: [place] AT, moist mucus membranes.  Trachea midline, no masses. ?Cardiovascular: No clubbing, cyanosis, or edema. ?Respiratory: Normal respiratory effort, no increased work of breathing. ?GI: Abdomen is soft, nontender, nondistended, no abdominal masses ?GU: No CVA tenderness ?Skin: No rashes, bruises or suspicious lesions. ?Neurologic: Grossly intact, no focal deficits, moving all 4 extremities. ?Psychiatric: Normal mood and affect. ? ?Laboratory Data: ? ?Urinalysis ?Dipstick/microscopy negative ? ?Assessment & Plan:   ? ?1.  Overactive bladder with urge incontinence ?Urinalysis unremarkable ?PVR 0 mL ?Recent onset of sciatica which may have exacerbated symptoms ?Not taking Solifenacin secondary to anticholinergic  side effects ?Trial Gemtesa 75 mg daily-samples given ?She will call back regarding efficacy and follow-up for persistent symptoms ? ? ?Abbie Sons, MD ? ?[place] ?[address]

## 2021-05-10 DIAGNOSIS — Z955 Presence of coronary angioplasty implant and graft: Secondary | ICD-10-CM | POA: Diagnosis not present

## 2021-05-10 DIAGNOSIS — Z48812 Encounter for surgical aftercare following surgery on the circulatory system: Secondary | ICD-10-CM | POA: Diagnosis not present

## 2021-05-10 DIAGNOSIS — I208 Other forms of angina pectoris: Secondary | ICD-10-CM | POA: Diagnosis not present

## 2021-05-10 NOTE — Progress Notes (Signed)
Daily Session Note ? ?Patient Details  ?Name: Keyra Virella ?MRN: [id number] ?Date of Birth: 02-22-1945 ?Referring Provider:   ?Flowsheet Row Cardiac Rehab from 03/29/2021 in [facility] Cardiac and Pulmonary Rehab  ?Referring Provider Corky Sox  ? ?  ? ? ?Encounter Date: 05/10/2021 ? ?Check In: ? Session Check In - 05/10/21 1339   ? ?  ? Check-In  ? Supervising physician immediately available to respond to emergencies See telemetry face sheet for immediately available ER MD   ? Location [facility]-Cardiac & Pulmonary Rehab   ? Staff Present Birdie Sons, MPA, RN;Joseph Ronald, Sharren [place], MS, ASCM CEP, Exercise Physiologist   ? Virtual Visit No   ? Medication changes reported     Yes   ? Comments no longer taking aspirin 73m, gentamycin ointment, IBgard, metronidazole, rosuvastatin, solifenacin, or trazodone 1084mtab   ? Fall or balance concerns reported    No   ? Warm-up and Cool-down Performed on first and last piece of equipment   ? Resistance Training Performed Yes   ? VAD Patient? No   ? PAD/SET Patient? No   ?  ? Pain Assessment  ? Currently in Pain? No/denies   ? ?  ?  ? ?  ? ? ? ? ? ?Social History  ? ?Tobacco Use  ?Smoking Status Former  ? Packs/day: 1.00  ? Years: 30.00  ? Pack years: 30.00  ? Types: Cigarettes  ? Quit date: 03/03/2016  ? Years since quitting: 5.1  ?Smokeless Tobacco Never  ? ? ?Goals Met:  ?Independence with exercise equipment ?Exercise tolerated well ?No report of concerns or symptoms today ?Strength training completed today ? ?Goals Unmet:  ?Not Applicable ? ?Comments: Pt able to follow exercise prescription today without complaint.  Will continue to monitor for progression. ? ? ? ?Dr. MaEmily Filberts Medical Director for [place] ?Dr. FuOttie Glaziers Medical Director for [facility]. ?

## 2021-05-12 ENCOUNTER — Encounter: Payer: Medicare HMO | Admitting: *Deleted

## 2021-05-12 DIAGNOSIS — Z48812 Encounter for surgical aftercare following surgery on the circulatory system: Secondary | ICD-10-CM | POA: Diagnosis not present

## 2021-05-12 DIAGNOSIS — Z955 Presence of coronary angioplasty implant and graft: Secondary | ICD-10-CM

## 2021-05-12 DIAGNOSIS — I208 Other forms of angina pectoris: Secondary | ICD-10-CM | POA: Diagnosis not present

## 2021-05-12 NOTE — Progress Notes (Signed)
Daily Session Note ? ?Patient Details  ?Name: Kristina Proctor ?MRN: [id number] ?Date of Birth: [birth year] ?Referring Provider:   ?Flowsheet Row Cardiac Rehab from 03/29/2021 in [facility] Cardiac and Pulmonary Rehab  ?Referring Provider Corky Sox  ? ?  ? ? ?Encounter Date: 05/12/2021 ? ?Check In: ? Session Check In - 05/12/21 1350   ? ?  ? Check-In  ? Supervising physician immediately available to respond to emergencies See telemetry face sheet for immediately available ER MD   ? Location [facility]-Cardiac & Pulmonary Rehab   ? Staff Present Nyoka Cowden, RN, BSN, Tyna Jaksch, MS, ASCM CEP, Exercise Physiologist;Melissa Tilford Pillar, RDN, LDN   ? Virtual Visit No   ? Medication changes reported     No   ? Fall or balance concerns reported    No   ? Tobacco Cessation No Change   ? Warm-up and Cool-down Performed on first and last piece of equipment   ? Resistance Training Performed Yes   ? VAD Patient? No   ? PAD/SET Patient? No   ?  ? Pain Assessment  ? Currently in Pain? No/denies   ? ?  ?  ? ?  ? ? ? ? ? ?Social History  ? ?Tobacco Use  ?Smoking Status Former  ? Packs/day: 1.00  ? Years: 30.00  ? Pack years: 30.00  ? Types: Cigarettes  ? Quit date: 03/03/2016  ? Years since quitting: 5.1  ?Smokeless Tobacco Never  ? ? ?Goals Met:  ?Independence with exercise equipment ?Exercise tolerated well ?No report of concerns or symptoms today ? ?Goals Unmet:  ?Not Applicable ? ?Comments: Pt able to follow exercise prescription today without complaint.  Will continue to monitor for progression.  ? ? ?Dr. Emily Filbert is Medical Director for [place].  ?Dr. Ottie Glazier is Medical Director for [facility] Pulmonary Rehabilitation. ?

## 2021-05-12 NOTE — Progress Notes (Signed)
Daily Session Note ? ?Patient Details  ?Name: Kristina Proctor ?MRN: [id number] ?Date of Birth: 03/27/1945 ?Referring Provider:   ?Flowsheet Row Cardiac Rehab from 03/29/2021 in [facility] Cardiac and Pulmonary Rehab  ?Referring Provider [place]  ? ?  ? ? ?Encounter Date: 05/12/2021 ? ?Check In: ? Session Check In - 05/12/21 1349   ? ?  ? Check-In  ? Supervising physician immediately available to respond to emergencies See telemetry face sheet for immediately available ER MD   ? Location [facility] Rehab   ? Staff Present Mary Jo Abernethy, RN, BSN, MA;Melissa Caiola, RDN, LDN;Kara Langdon, MS, ASCM CEP, Exercise Physiologist   ? Virtual Visit No   ? Medication changes reported     No   ? Fall or balance concerns reported    No   ? Tobacco Cessation No Change   ? Warm-up and Cool-down Performed on first and last piece of equipment   ? Resistance Training Performed Yes   ? VAD Patient? No   ? PAD/SET Patient? No   ?  ? Pain Assessment  ? Currently in Pain? No/denies   ? ?  ?  ? ?  ? ? ? ? ? ?Social History  ? ?Tobacco Use  ?Smoking Status Former  ? Packs/day: 1.00  ? Years: 30.00  ? Pack years: 30.00  ? Types: Cigarettes  ? Quit date: 03/03/2016  ? Years since quitting: 5.1  ?Smokeless Tobacco Never  ? ? ?Goals Met:  ?Independence with exercise equipment ?Exercise tolerated well ?No report of concerns or symptoms today ? ?Goals Unmet:  ?Not Applicable ? ?Comments: Pt able to follow exercise prescription today without complaint.  Will continue to monitor for progression.  ? ? ?Dr. Mark Miller is Medical Director for [facility].  ?Dr. Fuad Aleskerov is Medical Director for [facility]. ?

## 2021-05-13 ENCOUNTER — Encounter: Payer: Medicare HMO | Admitting: *Deleted

## 2021-05-13 DIAGNOSIS — Z48812 Encounter for surgical aftercare following surgery on the circulatory system: Secondary | ICD-10-CM | POA: Diagnosis not present

## 2021-05-13 DIAGNOSIS — I208 Other forms of angina pectoris: Secondary | ICD-10-CM | POA: Diagnosis not present

## 2021-05-13 DIAGNOSIS — Z955 Presence of coronary angioplasty implant and graft: Secondary | ICD-10-CM | POA: Diagnosis not present

## 2021-05-13 NOTE — Progress Notes (Signed)
Daily Session Note ? ?Patient Details  ?Name: Sarabeth Benton ?MRN: [id number] ?Date of Birth: 1945/11/11 ?Referring Provider:   ?Flowsheet Row Cardiac Rehab from 03/29/2021 in [facility] Cardiac and Pulmonary Rehab  ?Referring Provider Corky Sox  ? ?  ? ? ?Encounter Date: 05/13/2021 ? ?Check In: ? Session Check In - 05/13/21 1332   ? ?  ? Check-In  ? Supervising physician immediately available to respond to emergencies See telemetry face sheet for immediately available ER MD   ? Location [facility]   ? Staff Present Renita Papa, RN BSN;Joseph Hood, RCP,RRT,BSRT;Laureen [place], [place], RRT, CPFT   ? Virtual Visit No   ? Medication changes reported     No   ? Fall or balance concerns reported    No   ? Warm-up and Cool-down Performed on first and last piece of equipment   ? Resistance Training Performed Yes   ? VAD Patient? No   ? PAD/SET Patient? No   ?  ? Pain Assessment  ? Currently in Pain? No/denies   ? ?  ?  ? ?  ? ? ? ? ? ?Social History  ? ?Tobacco Use  ?Smoking Status Former  ? Packs/day: 1.00  ? Years: 30.00  ? Pack years: 30.00  ? Types: Cigarettes  ? Quit date: 03/03/2016  ? Years since quitting: 5.1  ?Smokeless Tobacco Never  ? ? ?Goals Met:  ?Independence with exercise equipment ?Exercise tolerated well ?No report of concerns or symptoms today ?Strength training completed today ? ?Goals Unmet:  ?Not Applicable ? ?Comments: Pt able to follow exercise prescription today without complaint.  Will continue to monitor for progression. ? ? ? ?Dr. Emily Filbert is Medical Director for [place].  ?Dr. Ottie Glazier is Medical Director for [facility] Pulmonary Rehabilitation. ?

## 2021-05-17 DIAGNOSIS — I208 Other forms of angina pectoris: Secondary | ICD-10-CM | POA: Diagnosis not present

## 2021-05-17 DIAGNOSIS — Z955 Presence of coronary angioplasty implant and graft: Secondary | ICD-10-CM | POA: Diagnosis not present

## 2021-05-17 DIAGNOSIS — Z48812 Encounter for surgical aftercare following surgery on the circulatory system: Secondary | ICD-10-CM | POA: Diagnosis not present

## 2021-05-17 NOTE — Progress Notes (Signed)
Daily Session Note ? ?Patient Details  ?Name: Jaydalynn Olivero ?MRN: [id number] ?Date of Birth: Mar 05, 1945 ?Referring Provider:   ?Flowsheet Row Cardiac Rehab from 03/29/2021 in [facility] Cardiac and Pulmonary Rehab  ?Referring Provider Corky Sox  ? ?  ? ? ?Encounter Date: 05/17/2021 ? ?Check In: ? Session Check In - 05/17/21 1342   ? ?  ? Check-In  ? Supervising physician immediately available to respond to emergencies See telemetry face sheet for immediately available ER MD   ? Location [facility]   ? Staff Present Birdie Sons, MPA, RN;Joseph [place], RCP,RRT,BSRT;Kara Mead, MS, ASCM CEP, Exercise Physiologist   ? Virtual Visit No   ? Medication changes reported     No   ? Fall or balance concerns reported    No   ? Tobacco Cessation No Change   ? Warm-up and Cool-down Performed on first and last piece of equipment   ? Resistance Training Performed Yes   ? VAD Patient? No   ? PAD/SET Patient? No   ?  ? Pain Assessment  ? Currently in Pain? No/denies   ? ?  ?  ? ?  ? ? ? ? ? ?Social History  ? ?Tobacco Use  ?Smoking Status Former  ? Packs/day: 1.00  ? Years: 30.00  ? Pack years: 30.00  ? Types: Cigarettes  ? Quit date: 03/03/2016  ? Years since quitting: 5.2  ?Smokeless Tobacco Never  ? ? ?Goals Met:  ?Independence with exercise equipment ?Exercise tolerated well ?No report of concerns or symptoms today ?Strength training completed today ? ?Goals Unmet:  ?Not Applicable ? ?Comments: Pt able to follow exercise prescription today without complaint.  Will continue to monitor for progression. ? ? ? ?Dr. Emily Filbert is Medical Director for [place].  ?Dr. Ottie Glazier is Medical Director for [facility] Pulmonary Rehabilitation. ?

## 2021-05-19 ENCOUNTER — Encounter: Payer: Self-pay | Admitting: *Deleted

## 2021-05-19 ENCOUNTER — Encounter: Payer: Medicare HMO | Admitting: *Deleted

## 2021-05-19 DIAGNOSIS — I208 Other forms of angina pectoris: Secondary | ICD-10-CM | POA: Diagnosis not present

## 2021-05-19 DIAGNOSIS — Z955 Presence of coronary angioplasty implant and graft: Secondary | ICD-10-CM

## 2021-05-19 DIAGNOSIS — Z48812 Encounter for surgical aftercare following surgery on the circulatory system: Secondary | ICD-10-CM | POA: Diagnosis not present

## 2021-05-19 NOTE — Progress Notes (Signed)
Daily Session Note ? ?Patient Details  ?Name: Kristina Proctor ?MRN: [id number] ?Date of Birth: [birth year] ?Referring Provider:   ?Flowsheet Row Cardiac Rehab from 03/29/2021 in [facility] Cardiac and Pulmonary Rehab  ?Referring Provider Corky Sox  ? ?  ? ? ?Encounter Date: 05/19/2021 ? ?Check In: ? Session Check In - 05/19/21 1331   ? ?  ? Check-In  ? Supervising physician immediately available to respond to emergencies See telemetry face sheet for immediately available ER MD   ? Location [facility]   ? Staff Present Renita Papa, RN BSN;Joseph Leonard, RCP,RRT,BSRT;Kara Raymond, [place], ASCM CEP, Exercise Physiologist   ? Virtual Visit No   ? Medication changes reported     No   ? Fall or balance concerns reported    No   ? Warm-up and Cool-down Performed on first and last piece of equipment   ? Resistance Training Performed Yes   ? VAD Patient? No   ? PAD/SET Patient? No   ?  ? Pain Assessment  ? Currently in Pain? No/denies   ? ?  ?  ? ?  ? ? ? ? ? ?Social History  ? ?Tobacco Use  ?Smoking Status Former  ? Packs/day: 1.00  ? Years: 30.00  ? Pack years: 30.00  ? Types: Cigarettes  ? Quit date: 03/03/2016  ? Years since quitting: 5.2  ?Smokeless Tobacco Never  ? ? ?Goals Met:  ?Independence with exercise equipment ?Exercise tolerated well ?No report of concerns or symptoms today ?Strength training completed today ? ?Goals Unmet:  ?Not Applicable ? ?Comments: Pt able to follow exercise prescription today without complaint.  Will continue to monitor for progression. ? ? ? ?Dr. Emily Filbert is Medical Director for [place].  ?Dr. Ottie Glazier is Medical Director for [facility] Pulmonary Rehabilitation. ?

## 2021-05-19 NOTE — Progress Notes (Signed)
Cardiac Individual Treatment Plan ? ?Patient Details  ?Name: Kristina Proctor ?MRN: [id number] ?Date of Birth: 1945-03-19 ?Referring Provider:   ?Flowsheet Row Cardiac Rehab from 03/29/2021 in [facility] Cardiac and Pulmonary Rehab  ?Referring Provider Corky Sox  ? ?  ? ? ?Initial Encounter Date:  ?Flowsheet Row Cardiac Rehab from 03/29/2021 in [facility] Cardiac and Pulmonary Rehab  ?Date 03/29/21  ? ?  ? ? ?Visit Diagnosis: Status post coronary artery stent placement ? ?Patient's Home Medications on Admission: ? ?Current Outpatient Medications:  ?  aspirin EC 81 MG tablet, Take 81 mg by mouth daily. Swallow whole., Disp: , Rfl:  ?  baclofen (LIORESAL) 10 MG tablet, Take 10 mg by mouth daily., Disp: , Rfl:  ?  Calcium Carb-Cholecalciferol 500-10 MG-MCG CHEW, Chew 2 each by mouth daily., Disp: , Rfl:  ?  clopidogrel (PLAVIX) 75 MG tablet, Take 1 tablet (75 mg total) by mouth daily., Disp: 90 tablet, Rfl: 3 ?  diphenhydrAMINE (BENADRYL) 25 MG tablet, Take 25 mg by mouth daily., Disp: , Rfl:  ?  estradiol (ESTRACE) 0.1 MG/GM vaginal cream, Place 1 Applicatorful vaginally once a week., Disp: , Rfl:  ?  fluticasone (FLONASE) 50 MCG/ACT nasal spray, Place 2 sprays into both nostrils daily., Disp: , Rfl:  ?  gentamicin ointment (GARAMYCIN) 0.1 %, Apply 1 application topically 3 (three) times daily as needed (irritation around nose)., Disp: , Rfl:  ?  Melatonin 10 MG TABS, Take 10 mg by mouth at bedtime as needed (sleep)., Disp: , Rfl:  ?  metoprolol succinate (TOPROL-XL) 25 MG 24 hr tablet, Take 25 mg by mouth daily., Disp: , Rfl:  ?  Multiple Vitamins-Minerals (MULTIVITAMIN WITH MINERALS) tablet, Take 2 tablets by mouth daily., Disp: , Rfl:  ?  Peppermint Oil (IBGARD) 90 MG CPCR, Take 90 mg by mouth daily., Disp: , Rfl:  ?  PREMARIN vaginal cream, Place 0.5 g vaginally 2 (two) times a week., Disp: , Rfl:  ?  rosuvastatin (CRESTOR) 10 MG tablet, Take 1 tablet by mouth daily., Disp: , Rfl:  ?  solifenacin (VESICARE) 10 MG tablet, Take 10 mg by  mouth daily as needed (overactive bladder)., Disp: , Rfl:  ?  traZODone (DESYREL) 50 MG tablet, Take 50 mg by mouth at bedtime., Disp: , Rfl:  ?  Vibegron (GEMTESA) 75 MG TABS, Take 75 mg by mouth daily., Disp: 28 tablet, Rfl: 0 ? ?Past Medical History: ?Past Medical History:  ?Diagnosis Date  ? Overactive bladder   ? Venous insufficiency   ? ? ?Tobacco Use: ?Social History  ? ?Tobacco Use  ?Smoking Status Former  ? Packs/day: 1.00  ? Years: 30.00  ? Pack years: 30.00  ? Types: Cigarettes  ? Quit date: 03/03/2016  ? Years since quitting: 5.2  ?Smokeless Tobacco Never  ? ? ?Labs: ?Review Flowsheet   ? ?    ? View : No data to display.  ?  ?  ?  ?  ?  ? ? ? ?Exercise Target Goals: ?Exercise Program Goal: ?Individual exercise prescription set using results from initial 6 min walk test and THRR while considering  patient?s activity barriers and safety.  ? ?Exercise Prescription Goal: ?Initial exercise prescription builds to 30-45 minutes a day of aerobic activity, 2-3 days per week.  Home exercise guidelines will be given to patient during program as part of exercise prescription that the participant will acknowledge. ? ? ?Education: Aerobic Exercise: ?- Group verbal and visual presentation on the components of exercise prescription. Introduces F.I.T.T principle  from ACSM for exercise prescriptions.  Reviews F.I.T.T. principles of aerobic exercise including progression. Written material given at graduation. ? ? ?Education: Resistance Exercise: ?- Group verbal and visual presentation on the components of exercise prescription. Introduces F.I.T.T principle from ACSM for exercise prescriptions  Reviews F.I.T.T. principles of resistance exercise including progression. Written material given at graduation. ?Flowsheet Row Cardiac Rehab from 05/12/2021 in [facility] Cardiac and Pulmonary Rehab  ?Date 04/07/21  ?Educator AS  ?Instruction Review Code 1- Verbalizes Understanding  ? ?  ? ?  ?Education: Exercise & Equipment Safety: ?-  Individual verbal instruction and demonstration of equipment use and safety with use of the equipment. ?Flowsheet Row Cardiac Rehab from 05/12/2021 in [facility] Cardiac and Pulmonary Rehab  ?Date 03/29/21  ?Educator Susan Moore  ?Instruction Review Code 1- Verbalizes Understanding  ? ?  ? ? ?Education: Exercise Physiology & General Exercise Guidelines: ?- Group verbal and written instruction with models to review the exercise physiology of the cardiovascular system and associated critical values. Provides general exercise guidelines with specific guidelines to those with heart or lung disease.  ?Flowsheet Row Cardiac Rehab from 05/12/2021 in [facility] Cardiac and Pulmonary Rehab  ?Education need identified 03/29/21  ? ?  ? ? ?Education: Flexibility, Balance, Mind/Body Relaxation: ?- Group verbal and visual presentation with interactive activity on the components of exercise prescription. Introduces F.I.T.T principle from ACSM for exercise prescriptions. Reviews F.I.T.T. principles of flexibility and balance exercise training including progression. Also discusses the mind body connection.  Reviews various relaxation techniques to help reduce and manage stress (i.e. Deep breathing, progressive muscle relaxation, and visualization). Balance handout provided to take home. Written material given at graduation. ?Flowsheet Row Cardiac Rehab from 05/12/2021 in [facility] Cardiac and Pulmonary Rehab  ?Education need identified 03/29/21  ?Date 04/21/21  ?Educator AS  ?Instruction Review Code 1- Verbalizes Understanding  ? ?  ? ? ?Activity Barriers & Risk Stratification: ? ? ?6 Minute Walk: ? 6 Minute Walk   ? ? [place] Name 03/29/21 1601  ?  ?  ?  ? 6 Minute Walk  ? Phase Initial    ? Distance 1585 feet    ? Walk Time 6 minutes    ? # of Rest Breaks 0    ? MPH 3    ? METS 3.41    ? RPE 8    ? Perceived Dyspnea  0    ? VO2 Peak 11.95    ? Symptoms No    ? Resting HR 74 bpm    ? Resting BP 110/70    ? Resting Oxygen Saturation  97 %    ? Exercise Oxygen  Saturation  during 6 min walk 97 %    ? Max Ex. HR 109 bpm    ? Max Ex. BP 134/72    ? 2 Minute Post BP 112/84    ? ?  ?  ? ?  ? ? ?Oxygen Initial Assessment: ? ? ?Oxygen Re-Evaluation: ? ? ?Oxygen Discharge (Final Oxygen Re-Evaluation): ? ? ?Initial Exercise Prescription: ? Initial Exercise Prescription - 03/29/21 1600   ? ?  ? Date of Initial Exercise RX and Referring Provider  ? Date 03/29/21   ? Referring Provider Corky Sox   ?  ? Oxygen  ? Maintain Oxygen Saturation 88% or higher   ?  ? Treadmill  ? MPH 3   ? Grade 1   ? Minutes 15   ? METs 3.71   ?  ? Recumbant Bike  ? Level 2   ?  RPM 60   ? Minutes 15   ? METs 3.41   ?  ? REL-XR  ? Level 3   ? Speed 50   ? Minutes 15   ? METs 3.41   ?  ? T5 Nustep  ? Level 2   ? SPM 80   ? Minutes 15   ? METs 3.41   ?  ? Prescription Details  ? Frequency (times per week) 2   ? Duration Progress to 30 minutes of continuous aerobic without signs/symptoms of physical distress   ?  ? Intensity  ? THRR 40-80% of Max Heartrate 102-130   ? Ratings of Perceived Exertion 11-13   ? Perceived Dyspnea 0-4   ?  ? Progression  ? Progression Continue to progress workloads to maintain intensity without signs/symptoms of physical distress.   ?  ? Resistance Training  ? Training Prescription Yes   ? Weight 2   ? Reps 10-15   ? ?  ?  ? ?  ? ? ?Perform Capillary Blood Glucose checks as needed. ? ?Exercise Prescription Changes: ? ? Exercise Prescription Changes   ? ? [place] Name 03/29/21 1600 04/19/21 1500 04/22/21 1300  ?  ?  ?  ? Response to Exercise  ? Blood Pressure (Admit) 110/70 124/64 --    ? Blood Pressure (Exercise) 134/72 124/68 --    ? Blood Pressure (Exit) 112/84 110/58 --    ? Heart Rate (Admit) 74 bpm 70 bpm --    ? Heart Rate (Exercise) 109 bpm 103 bpm --    ? Heart Rate (Exit) 74 bpm 66 bpm --    ? Oxygen Saturation (Admit) 97 % -- --    ? Oxygen Saturation (Exercise) 97 % -- --    ? Oxygen Saturation (Exit) 97 % -- --    ? Rating of Perceived Exertion (Exercise) 8 11 --    ? Perceived  Dyspnea (Exercise) 0 -- --    ? Symptoms none none --    ? Comments 6 min walk test results -- --    ? Duration -- Continue with 30 min of aerobic exercise without signs/symptoms of physical distress. --    ? Inte

## 2021-05-24 DIAGNOSIS — Z955 Presence of coronary angioplasty implant and graft: Secondary | ICD-10-CM | POA: Diagnosis not present

## 2021-05-24 DIAGNOSIS — Z48812 Encounter for surgical aftercare following surgery on the circulatory system: Secondary | ICD-10-CM | POA: Diagnosis not present

## 2021-05-24 DIAGNOSIS — I208 Other forms of angina pectoris: Secondary | ICD-10-CM | POA: Diagnosis not present

## 2021-05-24 NOTE — Progress Notes (Signed)
Daily Session Note ? ?Patient Details  ?Name: Malerie Eakins ?MRN: [id number] ?Date of Birth: 05-08-1945 ?Referring Provider:   ?Flowsheet Row Cardiac Rehab from 03/29/2021 in [facility] Cardiac and Pulmonary Rehab  ?Referring Provider Corky Sox  ? ?  ? ? ?Encounter Date: 05/24/2021 ? ?Check In: ? Session Check In - 05/24/21 1348   ? ?  ? Check-In  ? Supervising physician immediately available to respond to emergencies See telemetry face sheet for immediately available ER MD   ? Location [facility]   ? Staff Present Birdie Sons, MPA, RN;Joseph [place], RCP,RRT,BSRT;Kara [place], MS, ASCM CEP, Exercise Physiologist   ? Virtual Visit No   ? Medication changes reported     No   ? Fall or balance concerns reported    No   ? Tobacco Cessation No Change   ? Warm-up and Cool-down Performed on first and last piece of equipment   ? Resistance Training Performed Yes   ? VAD Patient? No   ? PAD/SET Patient? No   ?  ? Pain Assessment  ? Currently in Pain? No/denies   ? ?  ?  ? ?  ? ? ? ? ? ?Social History  ? ?Tobacco Use  ?Smoking Status Former  ? Packs/day: 1.00  ? Years: 30.00  ? Pack years: 30.00  ? Types: Cigarettes  ? Quit date: 03/03/2016  ? Years since quitting: 5.2  ?Smokeless Tobacco Never  ? ? ?Goals Met:  ?Independence with exercise equipment ?Exercise tolerated well ?No report of concerns or symptoms today ?Strength training completed today ? ?Goals Unmet:  ?Not Applicable ? ?Comments: Pt able to follow exercise prescription today without complaint.  Will continue to monitor for progression. ? ? ? ?Dr. Emily Filbert is Medical Director for [place].  ?Dr. Ottie Glazier is Medical Director for [facility] Pulmonary Rehabilitation. ?

## 2021-05-27 DIAGNOSIS — S51812A Laceration without foreign body of left forearm, initial encounter: Secondary | ICD-10-CM | POA: Diagnosis not present

## 2021-05-27 DIAGNOSIS — L03115 Cellulitis of right lower limb: Secondary | ICD-10-CM | POA: Diagnosis not present

## 2021-05-31 ENCOUNTER — Encounter: Payer: Medicare HMO | Attending: Cardiology

## 2021-05-31 VITALS — Ht 65.75 in | Wt 149.5 lb

## 2021-05-31 DIAGNOSIS — Z955 Presence of coronary angioplasty implant and graft: Secondary | ICD-10-CM | POA: Insufficient documentation

## 2021-05-31 NOTE — Progress Notes (Signed)
Daily Session Note ? ?Patient Details  ?Name: Kristina Proctor ?MRN: [id number] ?Date of Birth: 1945/06/14 ?Referring Provider:   ?Flowsheet Row Cardiac Rehab from 03/29/2021 in [facility] Cardiac and Pulmonary Rehab  ?Referring Provider Corky Sox  ? ?  ? ? ?Encounter Date: 05/31/2021 ? ?Check In: ? Session Check In - 05/31/21 1338   ? ?  ? Check-In  ? Supervising physician immediately available to respond to emergencies See telemetry face sheet for immediately available ER MD   ? Location [facility] Re[facility]   ? Staff Present Birdie Sons, MPA, RN;Joseph [place], RCP,RRT,BSRT;Kara [place], MS, ASCM CEP, Exercise Physiologist   ? Virtual Visit No   ? Medication changes reported     No   ? Fall or balance concerns reported    No   ? Tobacco Cessation No Change   ? Warm-up and Cool-down Performed on first and last piece of equipment   ? Resistance Training Performed Yes   ? VAD Patient? No   ? PAD/SET Patient? No   ?  ? Pain Assessment  ? Currently in Pain? No/denies   ? ?  ?  ? ?  ? ? ? ? ? ?Social History  ? ?Tobacco Use  ?Smoking Status Former  ? Packs/day: 1.00  ? Years: 30.00  ? Pack years: 30.00  ? Types: Cigarettes  ? Quit date: 03/03/2016  ? Years since quitting: 5.2  ?Smokeless Tobacco Never  ? ? ?Goals Met:  ?Independence with exercise equipment ?Exercise tolerated well ?No report of concerns or symptoms today ?Strength training completed today ? ?Goals Unmet:  ?Not Applicable ? ?Comments: Pt able to follow exercise prescription today without complaint.  Will continue to monitor for progression. ? ? 6 Minute Walk   ? ? [place] Name 03/29/21 1601 05/31/21 1348  ?  ?  ? 6 Minute Walk  ? Phase Initial Discharge   ? Distance 1585 feet 1675 feet   ? Distance % Change -- 5.7 %   ? Distance Feet Change -- 90 ft   ? Walk Time 6 minutes 6 minutes   ? # of Rest Breaks 0 0   ? MPH 3 3.17   ? METS 3.41 3.36   ? RPE 8 10   ? Perceived Dyspnea  0 --   ? VO2 Peak 11.95 11.78   ? Symptoms No No   ? Resting HR 74 bpm 76 bpm   ? Resting BP  110/70 108/64   ? Resting Oxygen Saturation  97 % 97 %   ? Exercise Oxygen Saturation  during 6 min walk 97 % 97 %   ? Max Ex. HR 109 bpm 105 bpm   ? Max Ex. BP 134/72 116/60   ? 2 Minute Post BP 112/84 --   ? ?  ?  ? ?  ? ? ? ?Dr. Emily Filbert is Medical Director for [place].  ?Dr. Ottie Glazier is Medical Director for [facility] Pulmonary Rehabilitation. ?

## 2021-06-03 ENCOUNTER — Encounter: Payer: Medicare HMO | Admitting: *Deleted

## 2021-06-03 DIAGNOSIS — Z955 Presence of coronary angioplasty implant and graft: Secondary | ICD-10-CM

## 2021-06-03 NOTE — Progress Notes (Signed)
Daily Session Note ? ?Patient Details  ?Name: Kristina Proctor ?MRN: [id number] ?Date of Birth: [birth year] ?Referring Provider:   ?Flowsheet Row Cardiac Rehab from 03/29/2021 in [facility] Cardiac and Pulmonary Rehab  ?Referring Provider Corky Sox  ? ?  ? ? ?Encounter Date: 06/03/2021 ? ?Check In: ? Session Check In - 06/03/21 1348   ? ?  ? Check-In  ? Supervising physician immediately available to respond to emergencies See telemetry face sheet for immediately available ER MD   ? Location [facility]iac [facility] Pulmonary Rehab   ? Staff Present Heath Lark, RN, BSN, CCRP;Melissa [place]agar, RDN, LDN;Joseph [place], [place]   ? Virtual Visit No   ? Medication changes reported     No   ? Fall or balance concerns reported    No   ? Warm-up and Cool-down Performed on first and last piece of equipment   ? Resistance Training Performed Yes   ? VAD Patient? No   ? PAD/SET Patient? No   ?  ? Pain Assessment  ? Currently in Pain? No/denies   ? ?  ?  ? ?  ? ? ? ? ? ?Social History  ? ?Tobacco Use  ?Smoking Status Former  ? Packs/day: 1.00  ? Years: 30.00  ? Pack years: 30.00  ? Types: Cigarettes  ? Quit date: 03/03/2016  ? Years since quitting: 5.2  ?Smokeless Tobacco Never  ? ? ?Goals Met:  ?Independence with exercise equipment ?Exercise tolerated well ?Personal goals reviewed ?No report of concerns or symptoms today ? ?Goals Unmet:  ?Not Applicable ? ?Comments: Pt able to follow exercise prescription today without complaint.  Will continue to monitor for progression. ? ? ? ?Dr. Emily Filbert is Medical Director for [place].  ?Dr. Ottie Glazier is Medical Director for [facility] Pulmonary Rehabilitation. ?

## 2021-06-07 DIAGNOSIS — Z955 Presence of coronary angioplasty implant and graft: Secondary | ICD-10-CM

## 2021-06-07 DIAGNOSIS — L03115 Cellulitis of right lower limb: Secondary | ICD-10-CM | POA: Diagnosis not present

## 2021-06-07 NOTE — Progress Notes (Signed)
Daily Session Note ? ?Patient Details  ?Name: Elliett Guarisco ?MRN: [id number] ?Date of Birth: 06-17-1945 ?Referring Provider:   ?Flowsheet Row Cardiac Rehab from 03/29/2021 in [facility] Cardiac and Pulmonary Rehab  ?Referring Provider Corky Sox  ? ?  ? ? ?Encounter Date: 06/07/2021 ? ?Check In: ? Session Check In - 06/07/21 1351   ? ?  ? Check-In  ? Supervising physician immediately available to respond to emergencies See telemetry face sheet for immediately available ER MD   ? Location [facility]Card[facility] Pulmonary Rehab   ? Staff Present Birdie Sons, MPA, Nino Glow, MS, ASCM CEP, Exercise Physiologist;Jessica Perkins, MA, RCEP, CCRP, CCET;Melissa [place], RDN, LDN   ? Virtual Visit No   ? Medication changes reported     No   ? Fall or balance concerns reported    No   ? Tobacco Cessation No Change   ? Warm-up and Cool-down Performed on first and last piece of equipment   ? Resistance Training Performed Yes   ? VAD Patient? No   ? PAD/SET Patient? No   ?  ? Pain Assessment  ? Currently in Pain? No/denies   ? ?  ?  ? ?  ? ? ? ? ? ?Social History  ? ?Tobacco Use  ?Smoking Status Former  ? Packs/day: 1.00  ? Years: 30.00  ? Pack years: 30.00  ? Types: Cigarettes  ? Quit date: 03/03/2016  ? Years since quitting: 5.2  ?Smokeless Tobacco Never  ? ? ?Goals Met:  ?Independence with exercise equipment ?Exercise tolerated well ?No report of concerns or symptoms today ?Strength training completed today ? ?Goals Unmet:  ?Not Applicable ? ?Comments: Pt able to follow exercise prescription today without complaint.  Will continue to monitor for progression.  ? ? ?Dr. Emily Filbert is Medical Director for [place].  ?Dr. Ottie Glazier is Medical Director for [facility] Pulmonary Rehabilitation. ?

## 2021-06-09 DIAGNOSIS — Z955 Presence of coronary angioplasty implant and graft: Secondary | ICD-10-CM

## 2021-06-09 NOTE — Progress Notes (Signed)
Daily Session Note ? ?Patient Details  ?Name: Kristina Proctor ?MRN: [id number] ?Date of Birth: 10-22-1945 ?Referring Provider:   ?Flowsheet Row Cardiac Rehab from 03/29/2021 in [facility] Cardiac and Pulmonary Rehab  ?Referring Provider Corky Sox  ? ?  ? ? ?Encounter Date: 06/09/2021 ? ?Check In: ? Session Check In - 06/09/21 1344   ? ?  ? Check-In  ? Supervising physician immediately available to respond to emergencies See telemetry face sheet for immediately available ER MD   ? Location [facility]-Cardiac & Pulmonary Rehab   ? Staff Present Birdie Sons, MPA, RN;Joseph [place], RCP,RRT,BSRT;Melissa [place], RDN, LDN   ? Virtual Visit No   ? Medication changes reported     No   ? Fall or balance concerns reported    No   ? Tobacco Cessation No Change   ? Warm-up and Cool-down Performed on first and last piece of equipment   ? Resistance Training Performed Yes   ? VAD Patient? No   ? PAD/SET Patient? No   ?  ? Pain Assessment  ? Currently in Pain? No/denies   ? ?  ?  ? ?  ? ? ? ? ? ?Social History  ? ?Tobacco Use  ?Smoking Status Former  ? Packs/day: 1.00  ? Years: 30.00  ? Pack years: 30.00  ? Types: Cigarettes  ? Quit date: 03/03/2016  ? Years since quitting: 5.2  ?Smokeless Tobacco Never  ? ? ?Goals Met:  ?Independence with exercise equipment ?Exercise tolerated well ?No report of concerns or symptoms today ?Strength training completed today ? ?Goals Unmet:  ?Not Applicable ? ?Comments: Pt able to follow exercise prescription today without complaint.  Will continue to monitor for progression. ? ? ? ?Dr. Emily Filbert is Medical Director for [place].  ?Dr. Ottie Glazier is Medical Director for [facility] Pulmonary Rehabilitation. ?

## 2021-06-10 DIAGNOSIS — Z955 Presence of coronary angioplasty implant and graft: Secondary | ICD-10-CM

## 2021-06-10 NOTE — Patient Instructions (Signed)
Discharge Patient Instructions ? ?Patient Details  ?Name: Kristina Proctor ?MRN: [id number] ?Date of Birth: 24-May-1945 ?Referring Provider:  Andrez Grime, MD ? ? ?Number of Visits: 31 ? ?Reason for Discharge:  ?Patient reached a stable level of exercise. ?Patient independent in their exercise. ?Patient has met program and personal goals. ? ?Smoking History:  ?Social History  ? ?Tobacco Use  ?Smoking Status Former  ? Packs/day: 1.00  ? Years: 30.00  ? Pack years: 30.00  ? Types: Cigarettes  ? Quit date: 03/03/2016  ? Years since quitting: 5.2  ?Smokeless Tobacco Never  ? ? ?Diagnosis:  ?Status post coronary artery stent placement ? ?Initial Exercise Prescription: ? Initial Exercise Prescription - 03/29/21 1600   ? ?  ? Date of Initial Exercise RX and Referring Provider  ? Date 03/29/21   ? Referring Provider Corky Sox   ?  ? Oxygen  ? Maintain Oxygen Saturation 88% or higher   ?  ? Treadmill  ? MPH 3   ? Grade 1   ? Minutes 15   ? METs 3.71   ?  ? Recumbant Bike  ? Level 2   ? RPM 60   ? Minutes 15   ? METs 3.41   ?  ? REL-XR  ? Level 3   ? Speed 50   ? Minutes 15   ? METs 3.41   ?  ? T5 Nustep  ? Level 2   ? SPM 80   ? Minutes 15   ? METs 3.41   ?  ? Prescription Details  ? Frequency (times per week) 2   ? Duration Progress to 30 minutes of continuous aerobic without signs/symptoms of physical distress   ?  ? Intensity  ? THRR 40-80% of Max Heartrate 102-130   ? Ratings of Perceived Exertion 11-13   ? Perceived Dyspnea 0-4   ?  ? Progression  ? Progression Continue to progress workloads to maintain intensity without signs/symptoms of physical distress.   ?  ? Resistance Training  ? Training Prescription Yes   ? Weight 2   ? Reps 10-15   ? ?  ?  ? ?  ? ? ?Discharge Exercise Prescription (Final Exercise Prescription Changes): ? Exercise Prescription Changes - 05/31/21 1100   ? ?  ? Response to Exercise  ? Blood Pressure (Admit) 112/60   ? Blood Pressure (Exit) 90/58   ? Heart Rate (Admit) 97 bpm   ? Heart Rate (Exercise)  112 bpm   ? Heart Rate (Exit) 87 bpm   ? Symptoms none   ? Duration Continue with 30 min of aerobic exercise without signs/symptoms of physical distress.   ? Intensity THRR unchanged   ?  ? Progression  ? Progression Continue to progress workloads to maintain intensity without signs/symptoms of physical distress.   ? Average METs 3.65   ?  ? Resistance Training  ? Training Prescription Yes   ? Weight 7 lb   ? Reps 10-15   ?  ? Interval Training  ? Interval Training No   ?  ? Treadmill  ? MPH 3   ? Grade 2   ? Minutes 15   ? METs 4.12   ?  ? NuStep  ? Level 4   ? Minutes 15   ? METs 2.4   ?  ? Elliptical  ? Level 1   ? Minutes 15   ?  ? Home Exercise Plan  ? Plans to continue exercise at Home (  comment)   walking, staff videos  ? Frequency Add 2 additional days to program exercise sessions.   ? Initial Home Exercises Provided 04/22/21   ?  ? Oxygen  ? Maintain Oxygen Saturation 88% or higher   ? ?  ?  ? ?  ? ? ?Functional Capacity: ? 6 Minute Walk   ? ? [place] Name 03/29/21 1601 05/31/21 1348  ?  ?  ? 6 Minute Walk  ? Phase Initial Discharge   ? Distance 1585 feet 1675 feet   ? Distance % Change -- 5.7 %   ? Distance Feet Change -- 90 ft   ? Walk Time 6 minutes 6 minutes   ? # of Rest Breaks 0 0   ? MPH 3 3.17   ? METS 3.41 3.36   ? RPE 8 10   ? Perceived Dyspnea  0 --   ? VO2 Peak 11.95 11.78   ? Symptoms No No   ? Resting HR 74 bpm 76 bpm   ? Resting BP 110/70 108/64   ? Resting Oxygen Saturation  97 % 97 %   ? Exercise Oxygen Saturation  during 6 min walk 97 % 97 %   ? Max Ex. HR 109 bpm 105 bpm   ? Max Ex. BP 134/72 116/60   ? 2 Minute Post BP 112/84 --   ? ?  ?  ? ?  ? ? ? ?Nutrition & Weight - Outcomes: ? Pre Biometrics - 03/29/21 1609   ? ?  ? Pre Biometrics  ? Height 5' 5.75" (1.67 m)   ? Weight 147 lb 6.4 oz (66.9 kg)   ? BMI (Calculated) 23.97   ? Single Leg Stand 30 seconds   ? ?  ?  ? ?  ? ? Post Biometrics - 05/31/21 1349   ? ?  ?  Post  Biometrics  ? Height 5' 5.75" (1.67 m)   ? Weight 149 lb 8 oz (67.8 kg)    ? BMI (Calculated) 24.32   ? Single Leg Stand 30 seconds   ? ?  ?  ? ?  ? ? ?Nutrition: ? Nutrition Therapy & Goals - 04/05/21 1434   ? ?  ? Nutrition Therapy  ? Diet Heart healthy, low Na   ? Drug/Food Interactions Statins/Certain Fruits   ? Protein (specify units) 65g   ? Fiber 25 grams   ? Whole Grain Foods 3 servings   ? Saturated Fats 12 max. grams   ? Fruits and Vegetables 8 servings/day   ? Sodium 2 grams   ?  ? Personal Nutrition Goals  ? Nutrition Goal ST: attend nutrition education LT: continue with heart healthy changes made   ? Comments 76 y.o F admitted to rehab for s/p stent. Relevant medications include MVI, crestor, trazodone. PSHx cholecystectomy. B: 2 cups decaff coffee with creamer with stevia with bagel with some cream cheese or cereal (special K) D: salad (meat with vegetables - [place] dressing). She tries to stay away from starchy food. She tries to honor her hunger to avoid eat out at fast food. She doesn't cook much. She loves [place] food 1x/week - sweet and sour chicken with vegetables. rotisserie chicken - she puts it in her salad 2-3x/week. Drinks: diet pepsi. She doesn't salt her food since about 1 year ago. She uses whole wheat bread. She loves cinnmon apple sauce because she gets hungry at night and craves sweet things. She has some sherbert 1-2x/week. She has made  many changes since Sempt[place].   ?  ? Intervention Plan  ? Intervention Prescribe, educate and counsel regarding individualized specific dietary modifications aiming towards targeted core components such as weight, hypertension, lipid management, diabetes, heart failure and other comorbidities.   ? Expected Outcomes Short Term Goal: Understand basic principles of dietary content, such as calories, fat, sodium, cholesterol and nutrients.;Short Term Goal: A plan has been developed with personal nutrition goals set during dietitian appointment.;Long Term Goal: Adherence to prescribed nutrition plan.   ? ?  ?  ? ?   ? ? ? ?Goals reviewed with patient; copy given to patient. ?

## 2021-06-10 NOTE — Progress Notes (Signed)
Daily Session Note ? ?Patient Details  ?Name: Kristina Proctor ?MRN: [id number] ?Date of Birth: Nov 17, 1945 ?Referring Provider:   ?Flowsheet Row Cardiac Rehab from 03/29/2021 in [facility] Cardiac and Pulmonary Rehab  ?Referring Provider Corky Sox  ? ?  ? ? ?Encounter Date: 06/10/2021 ? ?Check In: ? Session Check In - 06/10/21 1344   ? ?  ? Check-In  ? Supervising physician immediately available to respond to emergencies See telemetry face sheet for immediately available ER MD   ? Location [facility]-Cardiac & Pulmonary Rehab   ? Staff Present Alberteen Sam, MA, RCEP, CCRP, CCET;Andreia Gandolfi, RN, BSN;Joseph [place], [place]   ? Virtual Visit No   ? Medication changes reported     No   ? Fall or balance concerns reported    No   ? Tobacco Cessation No Change   ? Warm-up and Cool-down Performed on first and last piece of equipment   ? Resistance Training Performed Yes   ? VAD Patient? No   ? PAD/SET Patient? No   ?  ? Pain Assessment  ? Currently in Pain? No/denies   ? ?  ?  ? ?  ? ? ? ? ? ?Social History  ? ?Tobacco Use  ?Smoking Status Former  ? Packs/day: 1.00  ? Years: 30.00  ? Pack years: 30.00  ? Types: Cigarettes  ? Quit date: 03/03/2016  ? Years since quitting: 5.2  ?Smokeless Tobacco Never  ? ? ?Goals Met:  ?Proper associated with RPD/PD & O2 Sat ?Independence with exercise equipment ?Using PLB without cueing & demonstrates good technique ?Exercise tolerated well ?No report of concerns or symptoms today ?Strength training completed today ? ?Goals Unmet:  ?Not Applicable ? ?Comments: Pt able to follow exercise prescription today without complaint.  Will continue to monitor for progression. ? ? ?Dr. Emily Filbert is Medical Director for [place].  ?Dr. Ottie Glazier is Medical Director for [facility] Pulmonary Rehabilitation. ?

## 2021-06-14 DIAGNOSIS — Z955 Presence of coronary angioplasty implant and graft: Secondary | ICD-10-CM

## 2021-06-14 NOTE — Progress Notes (Signed)
Daily Session Note ? ?Patient Details  ?Name: Kristina Proctor ?MRN: [id number] ?Date of Birth: 22-Feb-1945 ?Referring Provider:   ?Flowsheet Row Cardiac Rehab from 03/29/2021 in [facility] Cardiac and Pulmonary Rehab  ?Referring Provider Corky Sox  ? ?  ? ? ?Encounter Date: 06/14/2021 ? ?Check In: ? Session Check In - 06/14/21 1349   ? ?  ? Check-In  ? Supervising physician immediately available to respond to emergencies See telemetry face sheet for immediately available ER MD   ? Location [facility]-Cardiac & Pulmonary Rehab   ? Staff Present Birdie Sons, MPA, Nino Glow, MS, ASCM CEP, Exercise Physiologist;Joseph [place], [place]   ? Virtual Visit No   ? Medication changes reported     No   ? Fall or balance concerns reported    No   ? Tobacco Cessation No Change   ? Warm-up and Cool-down Performed on first and last piece of equipment   ? Resistance Training Performed Yes   ? VAD Patient? No   ? PAD/SET Patient? No   ?  ? Pain Assessment  ? Currently in Pain? No/denies   ? ?  ?  ? ?  ? ? ? ? ? ?Social History  ? ?Tobacco Use  ?Smoking Status Former  ? Packs/day: 1.00  ? Years: 30.00  ? Pack years: 30.00  ? Types: Cigarettes  ? Quit date: 03/03/2016  ? Years since quitting: 5.2  ?Smokeless Tobacco Never  ? ? ?Goals Met:  ?Independence with exercise equipment ?Exercise tolerated well ?No report of concerns or symptoms today ?Strength training completed today ? ?Goals Unmet:  ?Not Applicable ? ?Comments: Pt able to follow exercise prescription today without complaint.  Will continue to monitor for progression. ? ? ? ?Dr. Emily Filbert is Medical Director for [place].  ?Dr. Ottie Glazier is Medical Director for [facility] Pulmonary Rehabilitation. ?

## 2021-06-16 ENCOUNTER — Encounter: Payer: Self-pay | Admitting: *Deleted

## 2021-06-16 DIAGNOSIS — Z955 Presence of coronary angioplasty implant and graft: Secondary | ICD-10-CM

## 2021-06-16 NOTE — Progress Notes (Signed)
Daily Session Note ? ?Patient Details  ?Name: Kristina Proctor ?MRN: [id number] ?Date of Birth: 1945-09-23 ?Referring Provider:   ?Flowsheet Row Cardiac Rehab from 03/29/2021 in [facility] Cardiac and Pulmonary Rehab  ?Referring Provider Corky Sox  ? ?  ? ? ?Encounter Date: 06/16/2021 ? ?Check In: ? Session Check In - 06/16/21 1338   ? ?  ? Check-In  ? Supervising physician immediately available to respond to emergencies See telemetry face sheet for immediately available ER MD   ? Location [facility]-Cardiac & Pulmonary Rehab   ? Staff Present Birdie Sons, MPA, RN;Melissa [place], RDN, LDN;Joseph [place], RCP,RRT,BSRT   ? Virtual Visit No   ? Medication changes reported     No   ? Fall or balance concerns reported    No   ? Tobacco Cessation No Change   ? Warm-up and Cool-down Performed on first and last piece of equipment   ? Resistance Training Performed Yes   ? VAD Patient? No   ? PAD/SET Patient? No   ?  ? Pain Assessment  ? Currently in Pain? No/denies   ? ?  ?  ? ?  ? ? ? ? ? ?Social History  ? ?Tobacco Use  ?Smoking Status Former  ? Packs/day: 1.00  ? Years: 30.00  ? Pack years: 30.00  ? Types: Cigarettes  ? Quit date: 03/03/2016  ? Years since quitting: 5.2  ?Smokeless Tobacco Never  ? ? ?Goals Met:  ?Independence with exercise equipment ?Exercise tolerated well ?No report of concerns or symptoms today ?Strength training completed today ? ?Goals Unmet:  ?Not Applicable ? ?Comments: Pt able to follow exercise prescription today without complaint.  Will continue to monitor for progression. ? ? ? ?Dr. Emily Filbert is Medical Director for [place].  ?Dr. Ottie Glazier is Medical Director for [facility] Pulmonary Rehabilitation. ?

## 2021-06-16 NOTE — Progress Notes (Signed)
Cardiac Individual Treatment Plan ? ?Patient Details  ?Name: Kristina Proctor ?MRN: [id number] ?Date of Birth: [birth year] ?Referring Provider:   ?Flowsheet Row Cardiac Rehab from 03/29/2021 in [facility] Cardiac and Pulmonary Rehab  ?Referring Provider Corky Sox  ? ?  ? ? ?Initial Encounter Date:  ?Flowsheet Row Cardiac Rehab from 03/29/2021 in [facility] Cardiac and Pulmonary Rehab  ?Date 03/29/21  ? ?  ? ? ?Visit Diagnosis: Status post coronary artery stent placement ? ?Patient's Home Medications on Admission: ? ?Current Outpatient Medications:  ?  aspirin EC 81 MG tablet, Take 81 mg by mouth daily. Swallow whole., Disp: , Rfl:  ?  baclofen (LIORESAL) 10 MG tablet, Take 10 mg by mouth daily., Disp: , Rfl:  ?  Calcium Carb-Cholecalciferol 500-10 MG-MCG CHEW, Chew 2 each by mouth daily., Disp: , Rfl:  ?  clopidogrel (PLAVIX) 75 MG tablet, Take 1 tablet (75 mg total) by mouth daily., Disp: 90 tablet, Rfl: 3 ?  diphenhydrAMINE (BENADRYL) 25 MG tablet, Take 25 mg by mouth daily., Disp: , Rfl:  ?  estradiol (ESTRACE) 0.1 MG/GM vaginal cream, Place 1 Applicatorful vaginally once a week., Disp: , Rfl:  ?  fluticasone (FLONASE) 50 MCG/ACT nasal spray, Place 2 sprays into both nostrils daily., Disp: , Rfl:  ?  gentamicin ointment (GARAMYCIN) 0.1 %, Apply 1 application topically 3 (three) times daily as needed (irritation around nose)., Disp: , Rfl:  ?  Melatonin 10 MG TABS, Take 10 mg by mouth at bedtime as needed (sleep)., Disp: , Rfl:  ?  metoprolol succinate (TOPROL-XL) 25 MG 24 hr tablet, Take 25 mg by mouth daily., Disp: , Rfl:  ?  Multiple Vitamins-Minerals (MULTIVITAMIN WITH MINERALS) tablet, Take 2 tablets by mouth daily., Disp: , Rfl:  ?  Peppermint Oil (IBGARD) 90 MG CPCR, Take 90 mg by mouth daily., Disp: , Rfl:  ?  PREMARIN vaginal cream, Place 0.5 g vaginally 2 (two) times a week., Disp: , Rfl:  ?  rosuvastatin (CRESTOR) 10 MG tablet, Take 1 tablet by mouth daily., Disp: , Rfl:  ?  solifenacin (VESICARE) 10 MG tablet, Take 10 mg by  mouth daily as needed (overactive bladder)., Disp: , Rfl:  ?  traZODone (DESYREL) 50 MG tablet, Take 50 mg by mouth at bedtime., Disp: , Rfl:  ?  Vibegron (GEMTESA) 75 MG TABS, Take 75 mg by mouth daily., Disp: 28 tablet, Rfl: 0 ? ?Past Medical History: ?Past Medical History:  ?Diagnosis Date  ? Overactive bladder   ? Venous insufficiency   ? ? ?Tobacco Use: ?Social History  ? ?Tobacco Use  ?Smoking Status Former  ? Packs/day: 1.00  ? Years: 30.00  ? Pack years: 30.00  ? Types: Cigarettes  ? Quit date: 03/03/2016  ? Years since quitting: 5.2  ?Smokeless Tobacco Never  ? ? ?Labs: ?Review Flowsheet   ? ?    ? View : No data to display.  ?  ?  ?  ?  ?  ? ? ? ?Exercise Target Goals: ?Exercise Program Goal: ?Individual exercise prescription set using results from initial 6 min walk test and THRR while considering  patient?s activity barriers and safety.  ? ?Exercise Prescription Goal: ?Initial exercise prescription builds to 30-45 minutes a day of aerobic activity, 2-3 days per week.  Home exercise guidelines will be given to patient during program as part of exercise prescription that the participant will acknowledge. ? ? ?Education: Aerobic Exercise: ?- Group verbal and visual presentation on the components of exercise prescription. Introduces F.I.T.T principle  from ACSM for exercise prescriptions.  Reviews F.I.T.T. principles of aerobic exercise including progression. Written material given at graduation. ? ? ?Education: Resistance Exercise: ?- Group verbal and visual presentation on the components of exercise prescription. Introduces F.I.T.T principle from ACSM for exercise prescriptions  Reviews F.I.T.T. principles of resistance exercise including progression. Written material given at graduation. ?Flowsheet Row Cardiac Rehab from 06/09/2021 in [facility] Cardiac and Pulmonary Rehab  ?Date 04/07/21  ?Educator AS  ?Instruction Review Code 1- Verbalizes Understanding  ? ?  ? ?  ?Education: Exercise & Equipment Safety: ?-  Individual verbal instruction and demonstration of equipment use and safety with use of the equipment. ?Flowsheet Row Cardiac Rehab from 06/09/2021 in [facility] Cardiac and Pulmonary Rehab  ?Date 03/29/21  ?Educator [place]  ?Instruction Review Code 1- Verbalizes Understanding  ? ?  ? ? ?Education: Exercise Physiology & General Exercise Guidelines: ?- Group verbal and written instruction with models to review the exercise physiology of the cardiovascular system and associated critical values. Provides general exercise guidelines with specific guidelines to those with heart or lung disease.  ?Flowsheet Row Cardiac Rehab from 06/09/2021 in [facility] Cardiac and Pulmonary Rehab  ?Education need identified 03/29/21  ? ?  ? ? ?Education: Flexibility, Balance, Mind/Body Relaxation: ?- Group verbal and visual presentation with interactive activity on the components of exercise prescription. Introduces F.I.T.T principle from ACSM for exercise prescriptions. Reviews F.I.T.T. principles of flexibility and balance exercise training including progression. Also discusses the mind body connection.  Reviews various relaxation techniques to help reduce and manage stress (i.e. Deep breathing, progressive muscle relaxation, and visualization). Balance handout provided to take home. Written material given at graduation. ?Flowsheet Row Cardiac Rehab from 06/09/2021 in [facility] Cardiac and Pulmonary Rehab  ?Education need identified 03/29/21  ?Date 04/21/21  ?Educator AS  ?Instruction Review Code 1- Verbalizes Understanding  ? ?  ? ? ?Activity Barriers & Risk Stratification: ? ? ?6 Minute Walk: ? 6 Minute Walk   ? ? [place] Name 03/29/21 1601 05/31/21 1348  ?  ?  ? 6 Minute Walk  ? Phase Initial Discharge   ? Distance 1585 feet 1675 feet   ? Distance % Change -- 5.7 %   ? Distance Feet Change -- 90 ft   ? Walk Time 6 minutes 6 minutes   ? # of Rest Breaks 0 0   ? MPH 3 3.17   ? METS 3.41 3.36   ? RPE 8 10   ? Perceived Dyspnea  0 --   ? VO2 Peak 11.95 11.78   ?  Symptoms No No   ? Resting HR 74 bpm 76 bpm   ? Resting BP 110/70 108/64   ? Resting Oxygen Saturation  97 % 97 %   ? Exercise Oxygen Saturation  during 6 min walk 97 % 97 %   ? Max Ex. HR 109 bpm 105 bpm   ? Max Ex. BP 134/72 116/60   ? 2 Minute Post BP 112/84 --   ? ?  ?  ? ?  ? ? ?Oxygen Initial Assessment: ? ? ?Oxygen Re-Evaluation: ? ? ?Oxygen Discharge (Final Oxygen Re-Evaluation): ? ? ?Initial Exercise Prescription: ? Initial Exercise Prescription - 03/29/21 1600   ? ?  ? Date of Initial Exercise RX and Referring Provider  ? Date 03/29/21   ? Referring Provider Corky Sox   ?  ? Oxygen  ? Maintain Oxygen Saturation 88% or higher   ?  ? Treadmill  ? MPH 3   ?  Grade 1   ? Minutes 15   ? METs 3.71   ?  ? Recumbant Bike  ? Level 2   ? RPM 60   ? Minutes 15   ? METs 3.41   ?  ? REL-XR  ? Level 3   ? Speed 50   ? Minutes 15   ? METs 3.41   ?  ? T5 Nustep  ? Level 2   ? SPM 80   ? Minutes 15   ? METs 3.41   ?  ? Prescription Details  ? Frequency (times per week) 2   ? Duration Progress to 30 minutes of continuous aerobic without signs/symptoms of physical distress   ?  ? Intensity  ? THRR 40-80% of Max Heartrate 102-130   ? Ratings of Perceived Exertion 11-13   ? Perceived Dyspnea 0-4   ?  ? Progression  ? Progression Continue to progress workloads to maintain intensity without signs/symptoms of physical distress.   ?  ? Resistance Training  ? Training Prescription Yes   ? Weight 2   ? Reps 10-15   ? ?  ?  ? ?  ? ? ?Perform Capillary Blood Glucose checks as needed. ? ?Exercise Prescription Changes: ? ? Exercise Prescription Changes   ? ? [place] Name 03/29/21 1600 04/19/21 1500 04/22/21 1300 05/19/21 1500 05/31/21 1100  ?  ? Response to Exercise  ? Blood Pressure (Admit) 110/70 124/64 -- 104/64 112/60  ? Blood Pressure (Exercise) 134/72 124/68 -- -- --  ? Blood Pressure (Exit) 112/84 110/58 -- 102/58 90/58  ? Heart Rate (Admit) 74 bpm 70 bpm -- 79 bpm 97 bpm  ? Heart Rate (Exercise) 109 bpm 103 bpm -- 95 bpm 112 bpm  ? Heart  Rate (Exit) 74 bpm 66 bpm -- 84 bpm 87 bpm  ? Oxygen Saturation (Admit) 97 % -- -- 95 % --  ? Oxygen Saturation (Exercise) 97 % -- -- 99 % --  ? Oxygen Saturation (Exit) 97 % -- -- 96 % --  ? Rating of Perc

## 2021-06-17 DIAGNOSIS — Z955 Presence of coronary angioplasty implant and graft: Secondary | ICD-10-CM | POA: Diagnosis not present

## 2021-06-17 NOTE — Progress Notes (Signed)
Daily Session Note  Patient Details  Name: Kristina Proctor MRN: [id number] Date of Birth: Apr 06, 1945 Referring Provider:   Flowsheet Row Cardiac Rehab from 03/29/2021 in [facility] Cardiac and Pulmonary Rehab  Referring Provider Corky Sox       Encounter Date: 06/17/2021  Check In:  Session Check In - 06/17/21 1341       Check-In   Supervising physician immediately available to respond to emergencies See telemetry face sheet for immediately available ER MD    Location [facility]-Cardiac & Pulmonary Rehab    Staff Present Alberteen Sam, MA, RCEP, CCRP, CCET;Rayvion Stumph, RN, BSN;Joseph [place], [place]    Virtual Visit No    Medication changes reported     No    Fall or balance concerns reported    No    Tobacco Cessation No Change    Warm-up and Cool-down Performed on first and last piece of equipment    Resistance Training Performed Yes    VAD Patient? No    PAD/SET Patient? No      Pain Assessment   Currently in Pain? No/denies                Social History   Tobacco Use  Smoking Status Former   Packs/day: 1.00   Years: 30.00   Pack years: 30.00   Types: Cigarettes   Quit date: 03/03/2016   Years since quitting: 5.2  Smokeless Tobacco Never    Goals Met:  Proper associated with RPD/PD & O2 Sat Independence with exercise equipment Exercise tolerated well No report of concerns or symptoms today Strength training completed today  Goals Unmet:  Not Applicable  Comments: Pt able to follow exercise prescription today without complaint.  Will continue to monitor for progression.   Dr. Emily Filbert is Medical Director for [place].  Dr. Ottie Glazier is Medical Director for [facility] Pulmonary Rehabilitation.

## 2021-06-21 DIAGNOSIS — Z955 Presence of coronary angioplasty implant and graft: Secondary | ICD-10-CM

## 2021-06-21 NOTE — Progress Notes (Signed)
Daily Session Note  Patient Details  Name: Kristina Proctor MRN: [id number] Date of Birth: November 08, 1945 Referring Provider:   Flowsheet Row Cardiac Rehab from 03/29/2021 in [facility]  Referring Provider Corky Sox       Encounter Date: 06/21/2021  Check In:  Session Check In - 06/21/21 1343       Check-In   Supervising physician immediately available to respond to emergencies See telemetry face sheet for immediately available ER MD    Location [facility]    Staff Present Birdie Sons, MPA, Nino Glow, MS, ASCM CEP, Exercise Physiologist;Joseph Tessie Fass, [place]    Virtual Visit No    Medication changes reported     No    Fall or balance concerns reported    No    Tobacco Cessation No Change    Warm-up and Cool-down Performed on first and last piece of equipment    Resistance Training Performed Yes    VAD Patient? No    PAD/SET Patient? No      Pain Assessment   Currently in Pain? No/denies                Social History   Tobacco Use  Smoking Status Former   Packs/day: 1.00   Years: 30.00   Pack years: 30.00   Types: Cigarettes   Quit date: 03/03/2016   Years since quitting: 5.3  Smokeless Tobacco Never    Goals Met:  Independence with exercise equipment Exercise tolerated well No report of concerns or symptoms today Strength training completed today  Goals Unmet:  Not Applicable  Comments: Pt able to follow exercise prescription today without complaint.  Will continue to monitor for progression.    Dr. Emily Filbert is Medical Director for [place].  Dr. Ottie Glazier is Medical Director for [facility].

## 2021-06-23 DIAGNOSIS — Z955 Presence of coronary angioplasty implant and graft: Secondary | ICD-10-CM | POA: Diagnosis not present

## 2021-06-23 NOTE — Progress Notes (Signed)
Cardiac Individual Treatment Plan  Patient Details  Name: Kristina Proctor MRN: [id number] Date of Birth: 1945/10/02 Referring Provider:   Flowsheet Row Cardiac Rehab from 03/29/2021 in [facility] Cardiac and Pulmonary Rehab  Referring Provider Corky Sox       Initial Encounter Date:  Flowsheet Row Cardiac Rehab from 03/29/2021 in [facility] Cardiac and Pulmonary Rehab  Date 03/29/21       Visit Diagnosis: Status post coronary artery stent placement  Patient's Home Medications on Admission:  Current Outpatient Medications:    aspirin EC 81 MG tablet, Take 81 mg by mouth daily. Swallow whole., Disp: , Rfl:    baclofen (LIORESAL) 10 MG tablet, Take 10 mg by mouth daily., Disp: , Rfl:    Calcium Carb-Cholecalciferol 500-10 MG-MCG CHEW, Chew 2 each by mouth daily., Disp: , Rfl:    clopidogrel (PLAVIX) 75 MG tablet, Take 1 tablet (75 mg total) by mouth daily., Disp: 90 tablet, Rfl: 3   diphenhydrAMINE (BENADRYL) 25 MG tablet, Take 25 mg by mouth daily., Disp: , Rfl:    estradiol (ESTRACE) 0.1 MG/GM vaginal cream, Place 1 Applicatorful vaginally once a week., Disp: , Rfl:    fluticasone (FLONASE) 50 MCG/ACT nasal spray, Place 2 sprays into both nostrils daily., Disp: , Rfl:    gentamicin ointment (GARAMYCIN) 0.1 %, Apply 1 application topically 3 (three) times daily as needed (irritation around nose)., Disp: , Rfl:    Melatonin 10 MG TABS, Take 10 mg by mouth at bedtime as needed (sleep)., Disp: , Rfl:    metoprolol succinate (TOPROL-XL) 25 MG 24 hr tablet, Take 25 mg by mouth daily., Disp: , Rfl:    Multiple Vitamins-Minerals (MULTIVITAMIN WITH MINERALS) tablet, Take 2 tablets by mouth daily., Disp: , Rfl:    Peppermint Oil (IBGARD) 90 MG CPCR, Take 90 mg by mouth daily., Disp: , Rfl:    PREMARIN vaginal cream, Place 0.5 g vaginally 2 (two) times a week., Disp: , Rfl:    rosuvastatin (CRESTOR) 10 MG tablet, Take 1 tablet by mouth daily., Disp: , Rfl:    solifenacin (VESICARE) 10 MG tablet, Take 10 mg by  mouth daily as needed (overactive bladder)., Disp: , Rfl:    traZODone (DESYREL) 50 MG tablet, Take 50 mg by mouth at bedtime., Disp: , Rfl:    Vibegron (GEMTESA) 75 MG TABS, Take 75 mg by mouth daily., Disp: 28 tablet, Rfl: 0  Past Medical History: Past Medical History:  Diagnosis Date   Overactive bladder    Venous insufficiency     Tobacco Use: Social History   Tobacco Use  Smoking Status Former   Packs/day: 1.00   Years: 30.00   Pack years: 30.00   Types: Cigarettes   Quit date: 03/03/2016   Years since quitting: 5.3  Smokeless Tobacco Never    Labs: Review Flowsheet         View : No data to display.               Exercise Target Goals: Exercise Program Goal: Individual exercise prescription set using results from initial 6 min walk test and THRR while considering  patient's activity barriers and safety.   Exercise Prescription Goal: Initial exercise prescription builds to 30-45 minutes a day of aerobic activity, 2-3 days per week.  Home exercise guidelines will be given to patient during program as part of exercise prescription that the participant will acknowledge.   Education: Aerobic Exercise: - Group verbal and visual presentation on the components of exercise prescription. Introduces F.I.T.T principle  from ACSM for exercise prescriptions.  Reviews F.I.T.T. principles of aerobic exercise including progression. Written material given at graduation.   Education: Resistance Exercise: - Group verbal and visual presentation on the components of exercise prescription. Introduces F.I.T.T principle from ACSM for exercise prescriptions  Reviews F.I.T.T. principles of resistance exercise including progression. Written material given at graduation. Flowsheet Row Cardiac Rehab from 06/16/2021 in [facility] Cardiac and Pulmonary Rehab  Date 04/07/21  Educator AS  Instruction Review Code 1- Verbalizes Understanding        Education: Exercise & Equipment Safety: -  Individual verbal instruction and demonstration of equipment use and safety with use of the equipment. Flowsheet Row Cardiac Rehab from 06/16/2021 in [facility] Cardiac and Pulmonary Rehab  Date 03/29/21  Educator [facility]  Instruction Review Code 1- Verbalizes Understanding       Education: Exercise Physiology & General Exercise Guidelines: - Group verbal and written instruction with models to review the exercise physiology of the cardiovascular system and associated critical values. Provides general exercise guidelines with specific guidelines to those with heart or lung disease.  Flowsheet Row Cardiac Rehab from 06/16/2021 in [facility] Cardiac and Pulmonary Rehab  Education need identified 03/29/21       Education: Flexibility, Balance, Mind/Body Relaxation: - Group verbal and visual presentation with interactive activity on the components of exercise prescription. Introduces F.I.T.T principle from ACSM for exercise prescriptions. Reviews F.I.T.T. principles of flexibility and balance exercise training including progression. Also discusses the mind body connection.  Reviews various relaxation techniques to help reduce and manage stress (i.e. Deep breathing, progressive muscle relaxation, and visualization). Balance handout provided to take home. Written material given at graduation. Flowsheet Row Cardiac Rehab from 06/16/2021 in [facility] Cardiac and Pulmonary Rehab  Education need identified 03/29/21  Date 04/21/21  Educator AS  Instruction Review Code 1- Verbalizes Understanding       Activity Barriers & Risk Stratification:   6 Minute Walk:  6 Minute Walk     Row Name 03/29/21 1601 05/31/21 1348       6 Minute Walk   Phase Initial Discharge    Distance 1585 feet 1675 feet    Distance % Change -- 5.7 %    Distance Feet Change -- 90 ft    Walk Time 6 minutes 6 minutes    # of Rest Breaks 0 0    MPH 3 3.17    METS 3.41 3.36    RPE 8 10    Perceived Dyspnea  0 --    VO2 Peak 11.95 11.78     Symptoms No No    Resting HR 74 bpm 76 bpm    Resting BP 110/70 108/64    Resting Oxygen Saturation  97 % 97 %    Exercise Oxygen Saturation  during 6 min walk 97 % 97 %    Max Ex. HR 109 bpm 105 bpm    Max Ex. BP 134/72 116/60    2 Minute Post BP 112/84 --             Oxygen Initial Assessment:   Oxygen Re-Evaluation:   Oxygen Discharge (Final Oxygen Re-Evaluation):   Initial Exercise Prescription:  Initial Exercise Prescription - 03/29/21 1600       Date of Initial Exercise RX and Referring Provider   Date 03/29/21    Referring Provider Corky Sox      Oxygen   Maintain Oxygen Saturation 88% or higher      Treadmill   MPH 3  Grade 1    Minutes 15    METs 3.71      Recumbant Bike   Level 2    RPM 60    Minutes 15    METs 3.41      REL-XR   Level 3    Speed 50    Minutes 15    METs 3.41      T5 Nustep   Level 2    SPM 80    Minutes 15    METs 3.41      Prescription Details   Frequency (times per week) 2    Duration Progress to 30 minutes of continuous aerobic without signs/symptoms of physical distress      Intensity   THRR 40-80% of Max Heartrate 102-130    Ratings of Perceived Exertion 11-13    Perceived Dyspnea 0-4      Progression   Progression Continue to progress workloads to maintain intensity without signs/symptoms of physical distress.      Resistance Training   Training Prescription Yes    Weight 2    Reps 10-15             Perform Capillary Blood Glucose checks as needed.  Exercise Prescription Changes:   Exercise Prescription Changes     Row Name 03/29/21 1600 04/19/21 1500 04/22/21 1300 05/19/21 1500 05/31/21 1100     Response to Exercise   Blood Pressure (Admit) 110/70 124/64 -- 104/64 112/60   Blood Pressure (Exercise) 134/72 124/68 -- -- --   Blood Pressure (Exit) 112/84 110/58 -- 102/58 90/58   Heart Rate (Admit) 74 bpm 70 bpm -- 79 bpm 97 bpm   Heart Rate (Exercise) 109 bpm 103 bpm -- 95 bpm 112 bpm   Heart  Rate (Exit) 74 bpm 66 bpm -- 84 bpm 87 bpm   Oxygen Saturation (Admit) 97 % -- -- 95 % --   Oxygen Saturation (Exercise) 97 % -- -- 99 % --   Oxygen Saturation (Exit) 97 % -- -- 96 % --   Rating of Perceived Exertion (Exercise) 8 11 -- 13 --   Perceived Dyspnea (Exercise) 0 -- -- -- --   Symptoms none none -- none none   Comments 6 min walk test results -- -- -- --   Duration -- Continue with 30 min of aerobic exercise without signs/symptoms of physical distress. -- Continue with 30 min of aerobic exercise without signs/symptoms of physical distress. Continue with 30 min of aerobic exercise without signs/symptoms of physical distress.   Intensity -- THRR unchanged -- THRR unchanged THRR unchanged     Progression   Progression -- Continue to progress workloads to maintain intensity without signs/symptoms of physical distress. -- Continue to progress workloads to maintain intensity without signs/symptoms of physical distress. Continue to progress workloads to maintain intensity without signs/symptoms of physical distress.   Average METs -- 3.09 -- 2.95 3.65     Resistance Training   Training Prescription -- Yes -- Yes Yes   Weight -- 3 lb -- 7 lb 7 lb   Reps -- 10-15 -- 10-15 10-15     Interval Training   Interval Training -- No -- No No     Treadmill   MPH -- 3 -- 3.1 3   Grade -- 1 -- 1.5 2   Minutes -- 15 -- 15 15   METs -- 3.71 -- 4.01 4.12     Recumbant Bike   Level -- 2 -- -- --  [place]atts -- 19 -- -- --   Minutes -- 15 -- -- --   METs -- 2.9 -- -- --     NuStep   Level -- -- -- -- 4   Minutes -- -- -- -- 15   METs -- -- -- -- 2.4     Arm Ergometer   Level -- -- -- 1.2 --   Minutes -- -- -- 15 --   METs -- -- -- 1.8 --     Elliptical   Level -- -- -- -- 1   Minutes -- -- -- -- 15     REL-XR   Level -- 3 -- 3 --   Minutes -- 15 -- 15 --   METs -- 4 -- 2.4 --     Track   Laps -- 44 -- 42 --   Minutes -- 15 -- 15 --   METs -- 3.39 -- 3.28 --     Home Exercise  Plan   Plans to continue exercise at -- -- Home (comment)  walking, staff videos Home (comment)  walking, staff videos Home (comment)  walking, staff videos   Frequency -- -- Add 2 additional days to program exercise sessions. Add 2 additional days to program exercise sessions. Add 2 additional days to program exercise sessions.   Initial Home Exercises Provided -- -- 04/22/21 04/22/21 04/22/21     Oxygen   Maintain Oxygen Saturation -- 88% or higher -- 88% or higher 88% or higher    Row Name 06/15/21 0900             Response to Exercise   Blood Pressure (Admit) 118/60       Blood Pressure (Exit) 106/64       Heart Rate (Admit) 92 bpm       Heart Rate (Exercise) 97 bpm       Heart Rate (Exit) 92 bpm       Oxygen Saturation (Admit) 98 %       Oxygen Saturation (Exercise) 97 %       Oxygen Saturation (Exit) 95 %       Rating of Perceived Exertion (Exercise) 13       Symptoms none       Duration Continue with 30 min of aerobic exercise without signs/symptoms of physical distress.       Intensity THRR unchanged         Progression   Progression Continue to progress workloads to maintain intensity without signs/symptoms of physical distress.       Average METs 3.86         Resistance Training   Training Prescription Yes       Weight 7 lb       Reps 10-15         Interval Training   Interval Training No         Treadmill   MPH 3       Grade 6       Minutes 15       METs 5.78         Recumbant Bike   Level 3       Minutes 15         NuStep   Level 4       Minutes 15       METs 2.4         REL-XR   Level 3       Minutes 15  METs 3         Home Exercise Plan   Plans to continue exercise at Home (comment)  walking, staff videos       Frequency Add 2 additional days to program exercise sessions.       Initial Home Exercises Provided 04/22/21         Oxygen   Maintain Oxygen Saturation 88% or higher                Exercise Comments:   Exercise  Comments     Row Name 03/31/21 1340 06/23/21 1356         Exercise Comments First full day of exercise!  Patient was oriented to gym and equipment including functions, settings, policies, and procedures.  Patient's individual exercise prescription and treatment plan were reviewed.  All starting workloads were established based on the results of the 6 minute walk test done at initial orientation visit.  The plan for exercise progression was also introduced and progression will be customized based on patient's performance and goals. Alayiah graduated today from  rehab with 36 sessions completed.  Details of the patient's exercise prescription and what She needs to do in order to continue the prescription and progress were discussed with patient.  Patient was given a copy of prescription and goals.  Patient verbalized understanding.  [place] plans to continue to exercise by walking at home.               Exercise Goals and Review:   Exercise Goals     Row Name 03/29/21 1608             Exercise Goals   Increase Physical Activity Yes       Intervention Provide advice, education, support and counseling about physical activity/exercise needs.;Develop an individualized exercise prescription for aerobic and resistive training based on initial evaluation findings, risk stratification, comorbidities and participant's personal goals.       Expected Outcomes Short Term: Attend rehab on a regular basis to increase amount of physical activity.;Long Term: Add in home exercise to make exercise part of routine and to increase amount of physical activity.;Long Term: Exercising regularly at least 3-5 days a week.       Increase Strength and Stamina Yes       Intervention Provide advice, education, support and counseling about physical activity/exercise needs.;Develop an individualized exercise prescription for aerobic and resistive training based on initial evaluation findings, risk stratification,  comorbidities and participant's personal goals.       Expected Outcomes Short Term: Increase workloads from initial exercise prescription for resistance, speed, and METs.;Short Term: Perform resistance training exercises routinely during rehab and add in resistance training at home;Long Term: Improve cardiorespiratory fitness, muscular endurance and strength as measured by increased METs and functional capacity (6MWT)       Able to understand and use rate of perceived exertion (RPE) scale Yes       Intervention Provide education and explanation on how to use RPE scale       Expected Outcomes Short Term: Able to use RPE daily in rehab to express subjective intensity level;Long Term:  Able to use RPE to guide intensity level when exercising independently       Able to understand and use Dyspnea scale Yes       Intervention Provide education and explanation on how to use Dyspnea scale       Expected Outcomes Short Term: Able to use Dyspnea scale daily in rehab  to express subjective sense of shortness of breath during exertion;Long Term: Able to use Dyspnea scale to guide intensity level when exercising independently       Knowledge and understanding of Target Heart Rate Range (THRR) Yes       Intervention Provide education and explanation of THRR including how the numbers were predicted and where they are located for reference       Expected Outcomes Short Term: Able to state/look up THRR;Long Term: Able to use THRR to govern intensity when exercising independently;Short Term: Able to use daily as guideline for intensity in rehab       Able to check pulse independently Yes       Intervention Provide education and demonstration on how to check pulse in carotid and radial arteries.;Review the importance of being able to check your own pulse for safety during independent exercise       Expected Outcomes Short Term: Able to explain why pulse checking is important during independent exercise;Long Term: Able to  check pulse independently and accurately       Understanding of Exercise Prescription Yes       Intervention Provide education, explanation, and written materials on patient's individual exercise prescription       Expected Outcomes Short Term: Able to explain program exercise prescription;Long Term: Able to explain home exercise prescription to exercise independently                Exercise Goals Re-Evaluation :  Exercise Goals Re-Evaluation     Row Name 03/31/21 1340 04/19/21 1510 04/22/21 1348 05/19/21 1549 05/31/21 1151     Exercise Goal Re-Evaluation   Exercise Goals Review Increase Physical Activity;Able to understand and use rate of perceived exertion (RPE) scale;Knowledge and understanding of Target Heart Rate Range (THRR);Understanding of Exercise Prescription;Increase Strength and Stamina;Able to understand and use Dyspnea scale;Able to check pulse independently Increase Physical Activity;Increase Strength and Stamina;Understanding of Exercise Prescription Increase Physical Activity;Increase Strength and Stamina;Understanding of Exercise Prescription;Able to understand and use rate of perceived exertion (RPE) scale;Able to understand and use Dyspnea scale;Knowledge and understanding of Target Heart Rate Range (THRR);Able to check pulse independently Increase Physical Activity;Increase Strength and Stamina;Understanding of Exercise Prescription Increase Physical Activity;Increase Strength and Stamina;Understanding of Exercise Prescription   Comments Reviewed RPE and dyspnea scales, THR and program prescription with pt today.  Pt voiced understanding and was given a copy of goals to take home. Jaelie is off to a good start in rehab.  She has completed her first 6 full exercise sessions.  She is already up to 44 laps on the track and has increased to 3 lb handweights.  We will continue to monitor her progress. Reviewed home exercise with pt today.  Pt plans to walk and use staff videos at  home  for exercise.  Reviewed THR, pulse, RPE, sign and symptoms, pulse oximetery and when to call 911 or MD.  Also discussed weather considerations and indoor options.  Pt voiced understanding. Yoneko is doing well in rehab.  She is up to 3.1 mph on the treadmill and level 1.2 on the arm crank.  We will continue to montior her progress. Fraser Din continued to do well in rehab. She tried the elliptcal for the first time and was able to tolerate it for about 8 minutes. She worked her METS up on the treadmill up to 4.44. She also tried the T4 Nustep and tolerated it well. She is due for her post 6MWT soon and should improve significantly.  Expected Outcomes Short: Use RPE daily to regulate intensity. Long: Follow program prescription in THR. Short: Continue to attend rehab regularly Long: Continue to follow program prescription Short: Start to add in exercise at home Long: Conitnue to improve stamina Short: Continue to boost workloads Long: conitnue to improve stamina Short: Improve on post 6MWT Long: Continue to increase overall MET level    Row Name 06/15/21 0921             Exercise Goal Re-Evaluation   Exercise Goals Review Increase Physical Activity;Increase Strength and Stamina;Understanding of Exercise Prescription       Comments Lalonnie improved her post 6MWT by 90 ft.  She is planing to continue to walk after she graduates.       Expected Outcomes Continue to exercise independently                Discharge Exercise Prescription (Final Exercise Prescription Changes):  Exercise Prescription Changes - 06/15/21 0900       Response to Exercise   Blood Pressure (Admit) 118/60    Blood Pressure (Exit) 106/64    Heart Rate (Admit) 92 bpm    Heart Rate (Exercise) 97 bpm    Heart Rate (Exit) 92 bpm    Oxygen Saturation (Admit) 98 %    Oxygen Saturation (Exercise) 97 %    Oxygen Saturation (Exit) 95 %    Rating of Perceived Exertion (Exercise) 13    Symptoms none    Duration Continue with 30  min of aerobic exercise without signs/symptoms of physical distress.    Intensity THRR unchanged      Progression   Progression Continue to progress workloads to maintain intensity without signs/symptoms of physical distress.    Average METs 3.86      Resistance Training   Training Prescription Yes    Weight 7 lb    Reps 10-15      Interval Training   Interval Training No      Treadmill   MPH 3    Grade 6    Minutes 15    METs 5.78      Recumbant Bike   Level 3    Minutes 15      NuStep   Level 4    Minutes 15    METs 2.4      REL-XR   Level 3    Minutes 15    METs 3      Home Exercise Plan   Plans to continue exercise at Home (comment)   walking, staff videos   Frequency Add 2 additional days to program exercise sessions.    Initial Home Exercises Provided 04/22/21      Oxygen   Maintain Oxygen Saturation 88% or higher             Nutrition:  Target Goals: Understanding of nutrition guidelines, daily intake of sodium <1548m, cholesterol <2074m calories 30% from fat and 7% or less from saturated fats, daily to have 5 or more servings of fruits and vegetables.  Education: All About Nutrition: -Group instruction provided by verbal, written material, interactive activities, discussions, models, and posters to present general guidelines for heart healthy nutrition including fat, fiber, MyPlate, the role of sodium in heart healthy nutrition, utilization of the nutrition label, and utilization of this knowledge for meal planning. Follow up email sent as well. Written material given at graduation. Flow[facility]  Education need identified 03/29/21  Date  04/14/21  Educator [place]  Instruction Review Code 1- Verbalizes Understanding       Biometrics:  Pre Biometrics - 03/29/21 1609       Pre Biometrics   Height 5' 5.75" (1.67 m)    Weight 147 lb 6.4 oz (66.9 kg)    BMI (Calculated) 23.97     Single Leg Stand 30 seconds             Post Biometrics - 05/31/21 1349        Post  Biometrics   Height 5' 5.75" (1.67 m)    Weight 149 lb 8 oz (67.8 kg)    BMI (Calculated) 24.32    Single Leg Stand 30 seconds             Nutrition Therapy Plan and Nutrition Goals:  Nutrition Therapy & Goals - 04/05/21 1434       Nutrition Therapy   Diet Heart healthy, low Na    Drug/Food Interactions Statins/Certain Fruits    Protein (specify units) 65g    Fiber 25 grams    Whole Grain Foods 3 servings    Saturated Fats 12 max. grams    Fruits and Vegetables 8 servings/day    Sodium 2 grams      Personal Nutrition Goals   Nutrition Goal ST: attend nutrition education LT: continue with heart healthy changes made    Comments 76 y.o F admitted to rehab for s/p stent. Relevant medications include MVI, crestor, trazodone. PSHx cholecystectomy. B: 2 cups decaff coffee with creamer with stevia with bagel with some cream cheese or cereal (special K) D: salad (meat with vegetables - [place] dressing). She tries to stay away from starchy food. She tries to honor her hunger to avoid eat out at fast food. She doesn't cook much. She loves [place] food 1x/week - sweet and sour chicken with vegetables. rotisserie chicken - she puts it in her salad 2-3x/week. Drinks: diet pepsi. She doesn't salt her food since about 1 year ago. She uses whole wheat bread. She loves cinnmon apple sauce because she gets hungry at night and craves sweet things. She has some sherbert 1-2x/week. She has made many changes since Semptember.      Intervention Plan   Intervention Prescribe, educate and counsel regarding individualized specific dietary modifications aiming towards targeted core components such as weight, hypertension, lipid management, diabetes, heart failure and other comorbidities.    Expected Outcomes Short Term Goal: Understand basic principles of dietary content, such as calories, fat, sodium, cholesterol and  nutrients.;Short Term Goal: A plan has been developed with personal nutrition goals set during dietitian appointment.;Long Term Goal: Adherence to prescribed nutrition plan.             Nutrition Assessments:  MEDIFICTS Score Key: ?70 Need to make dietary changes  40-70 Heart Healthy Diet ? 40 Therapeutic Level Cholesterol Diet  Flowsheet Row Cardiac Rehab from 06/03/2021 in [facility] Cardiac and Pulmonary Rehab  Picture Your Plate Total Score on Admission 58  Picture Your Plate Total Score on Discharge 56      Picture Your Plate Scores: <81 Unhealthy dietary pattern with much room for improvement. 41-50 Dietary pattern unlikely to meet recommendations for good health and room for improvement. 51-60 More healthful dietary pattern, with some room for improvement.  >60 Healthy dietary pattern, although there may be some specific behaviors that could be improved.    Nutrition Goals Re-Evaluation:  Nutrition Goals Re-Evaluation     [place] Name 04/26/21 1408 06/03/21  1357           Goals   Current Weight -- 150 lb (68 kg)      Nutrition Goal ST: thaw frozen fruit, cook apples, eat berries as they will all be soft options. Try out peanut butter instead of peanuts. LT: increase variety, continue with heart healthy changes made Eat more greens in tandum with fruit.      Comment She is now looking for recipes with more variety - with her dentures she can't eat nuts or apples. Encouraged her to thaw frozen fruit, cook apples, eat berries, peanut butter instead of peanuts, cook vegetables thoroughly as these are all softer options. She was having some trouble with her stomach the other day- tomatoes and lettuce - it feels better now. She is continuing witht the changes she has already made with no issues. Ilene states she wants to eat more vegetables and fruit. She cant eat some of those things due to her dentures. Informed her that she could make smoothies to help get more servings of vegetables.       Expected Outcome ST: thaw frozen fruit, cook apples, eat berries as they will all be soft options. Try out peanut butter instead of peanuts. LT: increase variety, continue with heart healthy changes made Short: make smoothies for ingesting vegetables. Long: maintain a diet that adheres to her.               Nutrition Goals Discharge (Final Nutrition Goals Re-Evaluation):  Nutrition Goals Re-Evaluation - 06/03/21 1357       Goals   Current Weight 150 lb (68 kg)    Nutrition Goal Eat more greens in tandum with fruit.    Comment Valli states she wants to eat more vegetables and fruit. She cant eat some of those things due to her dentures. Informed her that she could make smoothies to help get more servings of vegetables.    Expected Outcome Short: make smoothies for ingesting vegetables. Long: maintain a diet that adheres to her.             Psychosocial: Target Goals: Acknowledge presence or absence of significant depression and/or stress, maximize coping skills, provide positive support system. Participant is able to verbalize types and ability to use techniques and skills needed for reducing stress and depression.   Education: Stress, Anxiety, and Depression - Group verbal and visual presentation to define topics covered.  Reviews how body is impacted by stress, anxiety, and depression.  Also discusses healthy ways to reduce stress and to treat/manage anxiety and depression.  Written material given at graduation. Flowsheet Row Cardiac Rehab from 06/16/2021 in [facility] Cardiac and Pulmonary Rehab  Date 05/19/21  Educator [place]  Instruction Review Code 1- Verbalizes Understanding       Education: Sleep Hygiene -Provides group verbal and written instruction about how sleep can affect your health.  Define sleep hygiene, discuss sleep cycles and impact of sleep habits. Review good sleep hygiene tips.    Initial Review & Psychosocial Screening:  Initial Psych Review & Screening -  03/15/21 1050       Initial Review   Current issues with Current Sleep Concerns    Comments She is always going and does not slow down and uses some medication to help her sleep. She has a positive outlook on life and does not take life for granted. Patient states that she had a problem and was more short of breath and wanted to learn how to get better.  She is looking forward to getting into rehab.      Family Dynamics   Good Support System? Yes    Comments She can look to her two daughters for support that are in town. They check on her almost everyday.      Barriers   Psychosocial barriers to participate in program The patient should benefit from training in stress management and relaxation.;There are no identifiable barriers or psychosocial needs.      Screening Interventions   Interventions Encouraged to exercise;Provide feedback about the scores to participant;To provide support and resources with identified psychosocial needs    Expected Outcomes Short Term goal: Utilizing psychosocial counselor, staff and physician to assist with identification of specific Stressors or current issues interfering with healing process. Setting desired goal for each stressor or current issue identified.;Long Term Goal: Stressors or current issues are controlled or eliminated.;Short Term goal: Identification and review with participant of any Quality of Life or Depression concerns found by scoring the questionnaire.;Long Term goal: The participant improves quality of Life and PHQ9 Scores as seen by post scores and/or verbalization of changes             Quality of Life Scores:   Quality of Life - 06/03/21 1531       Quality of Life Scores   Health/Function Pre 27.2 %    Health/Function Post 29.2 %    Health/Function % Change 7.35 %    Socioeconomic Pre 22.29 %    Socioeconomic Post 29.14 %    Socioeconomic % Change  30.73 %    Psych/Spiritual Pre 27.43 %    Psych/Spiritual Post 30 %     Psych/Spiritual % Change 9.37 %    Family Pre 24 %    Family Post 30 %    Family % Change 25 %    GLOBAL Pre 25.76 %    GLOBAL Post 29.47 %    GLOBAL % Change 14.4 %            Scores of 19 and below usually indicate a poorer quality of life in these areas.  A difference of  2-3 points is a clinically meaningful difference.  A difference of 2-3 points in the total score of the Quality of Life Index has been associated with significant improvement in overall quality of life, self-image, physical symptoms, and general health in studies assessing change in quality of life.  PHQ-9: Review Flowsheet        06/03/2021 03/29/2021  Depression screen PHQ 2/9  Decreased Interest 0 0  Down, Depressed, Hopeless 0 0  PHQ - 2 Score 0 0  Altered sleeping 0 0  Tired, decreased energy 0 0  Change in appetite 0 0  Feeling bad or failure about yourself  0 0  Trouble concentrating 0 0  Moving slowly or fidgety/restless 0 0  Suicidal thoughts 0 0  PHQ-9 Score 0 0  Difficult doing work/chores Not difficult at all Not difficult at all         Interpretation of Total Score  Total Score Depression Severity:  1-4 = Minimal depression, 5-9 = Mild depression, 10-14 = Moderate depression, 15-19 = Moderately severe depression, 20-27 = Severe depression   Psychosocial Evaluation and Intervention:  Psychosocial Evaluation - 03/15/21 1053       Psychosocial Evaluation & Interventions   Interventions Encouraged to exercise with the program and follow exercise prescription;Relaxation education;Stress management education    Comments She is always going and does not  slow down and uses some medication to help her sleep. She has a positive outlook on life and does not take life for granted. Patient states that she had a problem and was more short of breath and wanted to learn how to get better. She is looking forward to getting into rehab.She can look to her two daughters for support that are in town. They  check on her almost everyday.    Expected Outcomes Short: Start HeartTrack to help with mood. Long: Maintain a healthy mental state.    Continue Psychosocial Services  Follow up required by staff             Psychosocial Re-Evaluation:  Psychosocial Re-Evaluation     [place] Name 04/26/21 1413 06/03/21 1350           Psychosocial Re-Evaluation   Current issues with None Identified None Identified      Comments Her ex son-in law recently passed and she went to the funeral - he wanted a party so they went to a sports bar. She relies on daughters for support. 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 She is also looking for a job; her MD told her to make shure she is not pushing or pulling heavy objects. She reports sleeping well and last night she was so tired she could've fallen asleep much earlier in the day. She continues to focus on the posisitve and reports no stressors at this time. Sanya has a positive attitude and wants to join a gym. She states no depression and no stressors at the moment. She live everyday to the fullest. Her outlook on life and her health is good and she wants to continue to encourage others.      Expected Outcomes ST: continue to attend rehab LT: maintain positive attitude ST: join a gym to maintain positive mental health. LT: continue to have a positive attitude and go to a gym regularly.      Interventions Encouraged to attend Cardiac Rehabilitation for the exercise Encouraged to attend Cardiac Rehabilitation for the exercise      Continue Psychosocial Services  Follow up required by staff Follow up required by counselor               Psychosocial Discharge (Final Psychosocial Re-Evaluation):  Psychosocial Re-Evaluation - 06/03/21 1350       Psychosocial Re-Evaluation   Current issues with None Identified    Comments Clydia has a positive attitude and wants to join a gym. She states no depression and  no stressors at the moment. She live everyday to the fullest. Her outlook on life and her health is good and she wants to continue to encourage others.    Expected Outcomes ST: join a gym to maintain positive mental health. LT: continue to have a positive attitude and go to a gym regularly.    Interventions Encouraged to attend Cardiac Rehabilitation for the exercise    Continue Psychosocial Services  Follow up required by counselor             Vocational Rehabilitation: Provide vocational rehab assistance to qualifying candidates.   Vocational Rehab Evaluation & Intervention:   Education: Education Goals: Education classes will be provided on a variety of topics geared toward better understanding of heart health and risk factor modification. Participant will state understanding/return demonstration of topics presented as noted by education test scores.  Learning Barriers/Preferences:  Learning Barriers/Preferences - 03/15/21 1048  Learning Barriers/Preferences   Learning Barriers None    Learning Preferences None             General Cardiac Education Topics:  AED/CPR: - Group verbal and written instruction with the use of models to demonstrate the basic use of the AED with the basic ABC's of resuscitation.   Anatomy and Cardiac Procedures: - Group verbal and visual presentation and models provide information about basic cardiac anatomy and function. Reviews the testing methods done to diagnose heart disease and the outcomes of the test results. Describes the treatment choices: Medical Management, Angioplasty, or Coronary Bypass Surgery for treating various heart conditions including Myocardial Infarction, Angina, Valve Disease, and Cardiac Arrhythmias.  Written material given at graduation. Flowsheet Row Cardiac Rehab from 06/16/2021 in [facility] Cardiac and Pulmonary Rehab  Education need identified 03/29/21  Date 04/07/21  Educator [facility]  Instruction Review Code 1-  Verbalizes Understanding       Medication Safety: - Group verbal and visual instruction to review commonly prescribed medications for heart and lung disease. Reviews the medication, class of the drug, and side effects. Includes the steps to properly store meds and maintain the prescription regimen.  Written material given at graduation. Flowsheet Row Cardiac Rehab from 06/16/2021 in [facility] Cardiac and Pulmonary Rehab  Date 04/28/21  Educator [facility]  Instruction Review Code 1- Verbalizes Understanding       Intimacy: - Group verbal instruction through game format to discuss how heart and lung disease can affect sexual intimacy. Written material given at graduation..   Know Your Numbers and Heart Failure: - Group verbal and visual instruction to discuss disease risk factors for cardiac and pulmonary disease and treatment options.  Reviews associated critical values for Overweight/Obesity, Hypertension, Cholesterol, and Diabetes.  Discusses basics of heart failure: signs/symptoms and treatments.  Introduces Heart Failure Zone chart for action plan for heart failure.  Written material given at graduation. Flowsheet Row Cardiac Rehab from 06/16/2021 in [facility] Cardiac and Pulmonary Rehab  Education need identified 03/29/21       Infection Prevention: - Provides verbal and written material to individual with discussion of infection control including proper hand washing and proper equipment cleaning during exercise session. Flowsheet Row Cardiac Rehab from 06/16/2021 in [facility] Cardiac and Pulmonary Rehab  Date 03/29/21  Educator [facility]  Instruction Review Code 1- Verbalizes Understanding       Falls Prevention: - Provides verbal and written material to individual with discussion of falls prevention and safety. Flowsheet Row Cardiac Rehab from 06/16/2021 in [facility] Cardiac and Pulmonary Rehab  Date 03/29/21  Educator [facility]  Instruction Review Code 1- Verbalizes Understanding       Other: -Provides  group and verbal instruction on various topics (see comments)   Knowledge Questionnaire Score:  Knowledge Questionnaire Score - 06/03/21 1612       Knowledge Questionnaire Score   Pre Score 20/26    Post Score 24/26             Core Components/Risk Factors/Patient Goals at Admission:  Personal Goals and Risk Factors at Admission - 03/29/21 1610       Core Components/Risk Factors/Patient Goals on Admission    Weight Management Yes;Weight Maintenance    Intervention Weight Management: Develop a combined nutrition and exercise program designed to reach desired caloric intake, while maintaining appropriate intake of nutrient and fiber, sodium and fats, and appropriate energy expenditure required for the weight goal.;Weight Management: Provide education and appropriate resources to help participant work on and attain  dietary goals.;Weight Management/Obesity: Establish reasonable short term and long term weight goals.    Admit Weight 147 lb 6.4 oz (66.9 kg)    Goal Weight: Short Term 147 lb 6.4 oz (66.9 kg)    Goal Weight: Long Term 147 lb 6.4 oz (66.9 kg)    Expected Outcomes Short Term: Continue to assess and modify interventions until short term weight is achieved;Long Term: Adherence to nutrition and physical activity/exercise program aimed toward attainment of established weight goal;Weight Maintenance: Understanding of the daily nutrition guidelines, which includes 25-35% calories from fat, 7% or less cal from saturated fats, less than 27m cholesterol, less than 1.5gm of sodium, & 5 or more servings of fruits and vegetables daily;Understanding recommendations for meals to include 15-35% energy as protein, 25-35% energy from fat, 35-60% energy from carbohydrates, less than 2063mof dietary cholesterol, 20-35 gm of total fiber daily;Understanding of distribution of calorie intake throughout the day with the consumption of 4-5 meals/snacks    Lipids Yes    Intervention Provide education  and support for participant on nutrition & aerobic/resistive exercise along with prescribed medications to achieve LDL <7064mHDL >42m67m  Expected Outcomes Short Term: Participant states understanding of desired cholesterol values and is compliant with medications prescribed. Participant is following exercise prescription and nutrition guidelines.;Long Term: Cholesterol controlled with medications as prescribed, with individualized exercise RX and with personalized nutrition plan. Value goals: LDL < 70mg33mL > 40 mg.             Education:Diabetes - Individual verbal and written instruction to review signs/symptoms of diabetes, desired ranges of glucose level fasting, after meals and with exercise. Acknowledge that pre and post exercise glucose checks will be done for 3 sessions at entry of program.   Core Components/Risk Factors/Patient Goals Review:   Goals and Risk Factor Review     Row Name 04/26/21 1411 06/03/21 1345           Core Components/Risk Factors/Patient Goals Review   Personal Goals Review Heart Failure;Lipids Other;Heart Failure      Review She is taking all of her medications as directed without any major issues, she thinks they might be giving her a stomach ache when she takes them - suggested eating something small when taking them to see if that helps. She reports no HF symptoms. Amora coniues to take her medication with apple sauce to help alleviate stomach aches. She has been doing well in the program and has a positive attitude. She understands what her disease process is and be more aware of her blood thinner. She is very active and she sometmes gets a lot of sores and nicks.      Expected Outcomes ST: try her applesauce with medications LT: Continue to manage risk factors Short: be mindful of situations where she can get injured. Long: maintain keeping up with her medications independently.               Core Components/Risk Factors/Patient Goals at  Discharge (Final Review):   Goals and Risk Factor Review - 06/03/21 1345       Core Components/Risk Factors/Patient Goals Review   Personal Goals Review Other;Heart Failure    Review Davette coniues to take her medication with apple sauce to help alleviate stomach aches. She has been doing well in the program and has a positive attitude. She understands what her disease process is and be more aware of her blood thinner. She is very active and she sometmes gets a  lot of sores and nicks.    Expected Outcomes Short: be mindful of situations where she can get injured. Long: maintain keeping up with her medications independently.             ITP Comments:  ITP Comments     Row Name 03/15/21 1047 03/29/21 1600 03/31/21 1340 04/05/21 1524 04/21/21 0807   ITP Comments Virtual Visit completed. Patient informed on EP and RD appointment and 6 Minute walk test. Patient also informed of patient health questionnaires on My Chart. Patient Verbalizes understanding. Visit diagnosis can be found in [facility] 03/05/2021. Completed 6MWT and gym orientation. Initial ITP created and sent for review to Dr. Emily Filbert, Medical Director. First full day of exercise!  Patient was oriented to gym and equipment including functions, settings, policies, and procedures.  Patient's individual exercise prescription and treatment plan were reviewed.  All starting workloads were established based on the results of the 6 minute walk test done at initial orientation visit.  The plan for exercise progression was also introduced and progression will be customized based on patient's performance and goals. Completed initial RD consultation 30 Day review completed. Medical Director ITP review done, changes made as directed, and signed approval by Medical Director.    [place] Name 05/19/21 1247 06/16/21 0814 06/23/21 1356       ITP Comments 30 Day review completed. Medical Director ITP review done, changes made as directed, and signed approval by  Medical Director. 30 Day review completed. Medical Director ITP review done, changes made as directed, and signed approval by Medical Director. Analys graduated today from  rehab with 36 sessions completed.  Details of the patient's exercise prescription and what She needs to do in order to continue the prescription and progress were discussed with patient.  Patient was given a copy of prescription and goals.  Patient verbalized understanding.  Mykelle plans to continue to exercise by walking at home.              Comments: discharge ITP

## 2021-06-23 NOTE — Progress Notes (Signed)
Discharge Report Referring Provider: Grayland Ormond  Kristina Proctor graduated today from  rehab with 36 sessions completed.  Details of the patient's exercise prescription and what She needs to do in order to continue the prescription and progress were discussed with patient.  Patient was given a copy of prescription and goals.  Patient verbalized understanding.  [name] plans to continue to exercise by walking at home.   [name] 03/29/21 1601 05/31/21 1348       6 Minute Walk   Phase Initial Discharge    Distance 1585 feet 1675 feet    Distance % Change -- 5.7 %    Distance Feet Change -- 90 ft    Walk Time 6 minutes 6 minutes    # of Rest Breaks 0 0    MPH 3 3.17    METS 3.41 3.36    RPE 8 10    Perceived Dyspnea  0 --    VO2 Peak 11.95 11.78    Symptoms No No    Resting HR 74 bpm 76 bpm    Resting BP 110/70 108/64    Resting Oxygen Saturation  97 % 97 %    Exercise Oxygen Saturation  during 6 min walk 97 % 97 %    Max Ex. HR 109 bpm 105 bpm    Max Ex. BP 134/72 116/60    2 Minute Post BP 112/84 --

## 2021-06-23 NOTE — Progress Notes (Signed)
Daily Session Note  Patient Details  Name: Kristina Proctor MRN: [id number] Date of Birth: 1945-11-18 Referring Provider:   Flowsheet Row Cardiac Rehab from 03/29/2021 in [facility] Cardiac and Pulmonary Rehab  Referring Provider Corky Sox       Encounter Date: 06/23/2021  Check In:  Session Check In - 06/23/21 1355       Check-In   Supervising physician immediately available to respond to emergencies See telemetry face sheet for immediately available ER MD    Location [facility]-Cardiac & Pulmonary Rehab    Staff Present Birdie Sons, MPA, RN;Joseph [place], RCP,RRT,BSRT;Melissa Monroe, RDN, Tawanna Solo, MS, ASCM CEP, Exercise Physiologist    Virtual Visit No    Medication changes reported     No    Fall or balance concerns reported    No    Tobacco Cessation No Change    Warm-up and Cool-down Performed on first and last piece of equipment    Resistance Training Performed Yes    VAD Patient? No    PAD/SET Patient? No      Pain Assessment   Currently in Pain? No/denies                Social History   Tobacco Use  Smoking Status Former   Packs/day: 1.00   Years: 30.00   Pack years: 30.00   Types: Cigarettes   Quit date: 03/03/2016   Years since quitting: 5.3  Smokeless Tobacco Never    Goals Met:  Independence with exercise equipment Exercise tolerated well No report of concerns or symptoms today Strength training completed today  Goals Unmet:  Not Applicable  Comments:  Kristina Proctor graduated today from  rehab with 36 sessions completed.  Details of the patient's exercise prescription and what She needs to do in order to continue the prescription and progress were discussed with patient.  Patient was given a copy of prescription and goals.  Patient verbalized understanding.  Kristina Proctor plans to continue to exercise by walking at home.     Dr. Emily Filbert is Medical Director for [place].  Dr. Ottie Glazier is Medical Director for [facility] Pulmonary  Rehabilitation.

## 2021-07-01 ENCOUNTER — Other Ambulatory Visit: Payer: Self-pay | Admitting: *Deleted

## 2021-07-01 MED ORDER — GEMTESA 75 MG PO TABS: 75.0000 mg | ORAL_TABLET | Freq: Every day | ORAL | 11 refills | Status: DC

## 2021-07-22 DIAGNOSIS — Z789 Other specified health status: Secondary | ICD-10-CM | POA: Diagnosis not present

## 2021-07-22 DIAGNOSIS — I2510 Atherosclerotic heart disease of native coronary artery without angina pectoris: Secondary | ICD-10-CM | POA: Diagnosis not present

## 2021-08-17 DIAGNOSIS — R21 Rash and other nonspecific skin eruption: Secondary | ICD-10-CM | POA: Diagnosis not present

## 2021-09-10 ENCOUNTER — Other Ambulatory Visit: Payer: Self-pay | Admitting: Family Medicine

## 2021-09-10 DIAGNOSIS — Z1231 Encounter for screening mammogram for malignant neoplasm of breast: Secondary | ICD-10-CM

## 2021-09-27 DIAGNOSIS — M545 Low back pain, unspecified: Secondary | ICD-10-CM | POA: Diagnosis not present

## 2021-09-27 DIAGNOSIS — I83893 Varicose veins of bilateral lower extremities with other complications: Secondary | ICD-10-CM | POA: Diagnosis not present

## 2021-09-27 DIAGNOSIS — I2510 Atherosclerotic heart disease of native coronary artery without angina pectoris: Secondary | ICD-10-CM | POA: Diagnosis not present

## 2021-09-27 DIAGNOSIS — G8929 Other chronic pain: Secondary | ICD-10-CM | POA: Diagnosis not present

## 2021-10-07 ENCOUNTER — Ambulatory Visit
Admission: RE | Admit: 2021-10-07 | Discharge: 2021-10-07 | Disposition: A | Discharge status: Home / Self Care | Payer: Medicare HMO | Source: Ambulatory Visit | Attending: Family Medicine | Admitting: Family Medicine

## 2021-10-07 DIAGNOSIS — Z1231 Encounter for screening mammogram for malignant neoplasm of breast: Secondary | ICD-10-CM

## 2021-10-13 DIAGNOSIS — M5431 Sciatica, right side: Secondary | ICD-10-CM | POA: Diagnosis not present

## 2021-10-13 DIAGNOSIS — L989 Disorder of the skin and subcutaneous tissue, unspecified: Secondary | ICD-10-CM | POA: Diagnosis not present

## 2021-10-13 DIAGNOSIS — S39012A Strain of muscle, fascia and tendon of lower back, initial encounter: Secondary | ICD-10-CM | POA: Diagnosis not present

## 2021-10-13 DIAGNOSIS — M533 Sacrococcygeal disorders, not elsewhere classified: Secondary | ICD-10-CM | POA: Diagnosis not present

## 2021-10-26 DIAGNOSIS — Y92009 Unspecified place in unspecified non-institutional (private) residence as the place of occurrence of the external cause: Secondary | ICD-10-CM | POA: Diagnosis not present

## 2021-10-26 DIAGNOSIS — Z23 Encounter for immunization: Secondary | ICD-10-CM | POA: Diagnosis not present

## 2021-10-26 DIAGNOSIS — S81821A Laceration with foreign body, right lower leg, initial encounter: Secondary | ICD-10-CM | POA: Diagnosis not present

## 2021-10-26 DIAGNOSIS — S81811A Laceration without foreign body, right lower leg, initial encounter: Secondary | ICD-10-CM | POA: Diagnosis not present

## 2021-10-26 DIAGNOSIS — W010XXA Fall on same level from slipping, tripping and stumbling without subsequent striking against object, initial encounter: Secondary | ICD-10-CM | POA: Diagnosis not present

## 2021-10-26 DIAGNOSIS — S81812A Laceration without foreign body, left lower leg, initial encounter: Secondary | ICD-10-CM | POA: Diagnosis not present

## 2021-10-26 DIAGNOSIS — S81822A Laceration with foreign body, left lower leg, initial encounter: Secondary | ICD-10-CM | POA: Diagnosis not present

## 2021-11-01 DIAGNOSIS — S81811A Laceration without foreign body, right lower leg, initial encounter: Secondary | ICD-10-CM | POA: Diagnosis not present

## 2021-11-01 DIAGNOSIS — S81812A Laceration without foreign body, left lower leg, initial encounter: Secondary | ICD-10-CM | POA: Diagnosis not present

## 2021-12-07 DIAGNOSIS — L570 Actinic keratosis: Secondary | ICD-10-CM | POA: Diagnosis not present

## 2021-12-07 DIAGNOSIS — L821 Other seborrheic keratosis: Secondary | ICD-10-CM | POA: Diagnosis not present

## 2021-12-07 DIAGNOSIS — C44629 Squamous cell carcinoma of skin of left upper limb, including shoulder: Secondary | ICD-10-CM | POA: Diagnosis not present

## 2021-12-07 DIAGNOSIS — L578 Other skin changes due to chronic exposure to nonionizing radiation: Secondary | ICD-10-CM | POA: Diagnosis not present

## 2021-12-07 DIAGNOSIS — D485 Neoplasm of uncertain behavior of skin: Secondary | ICD-10-CM | POA: Diagnosis not present

## 2021-12-15 DIAGNOSIS — M5441 Lumbago with sciatica, right side: Secondary | ICD-10-CM | POA: Diagnosis not present

## 2021-12-15 DIAGNOSIS — N760 Acute vaginitis: Secondary | ICD-10-CM | POA: Diagnosis not present

## 2022-01-12 DIAGNOSIS — L570 Actinic keratosis: Secondary | ICD-10-CM | POA: Diagnosis not present

## 2022-01-17 DIAGNOSIS — M5442 Lumbago with sciatica, left side: Secondary | ICD-10-CM | POA: Diagnosis not present

## 2022-01-17 DIAGNOSIS — M79672 Pain in left foot: Secondary | ICD-10-CM | POA: Diagnosis not present

## 2022-01-20 DIAGNOSIS — I83893 Varicose veins of bilateral lower extremities with other complications: Secondary | ICD-10-CM | POA: Diagnosis not present

## 2022-01-20 DIAGNOSIS — R9439 Abnormal result of other cardiovascular function study: Secondary | ICD-10-CM | POA: Diagnosis not present

## 2022-01-20 DIAGNOSIS — Z789 Other specified health status: Secondary | ICD-10-CM | POA: Diagnosis not present

## 2022-01-20 DIAGNOSIS — R079 Chest pain, unspecified: Secondary | ICD-10-CM | POA: Diagnosis not present

## 2022-01-20 DIAGNOSIS — R0602 Shortness of breath: Secondary | ICD-10-CM | POA: Diagnosis not present

## 2022-01-20 DIAGNOSIS — I2089 Other forms of angina pectoris: Secondary | ICD-10-CM | POA: Diagnosis not present

## 2022-01-20 DIAGNOSIS — I25118 Atherosclerotic heart disease of native coronary artery with other forms of angina pectoris: Secondary | ICD-10-CM | POA: Diagnosis not present

## 2022-01-20 DIAGNOSIS — I2510 Atherosclerotic heart disease of native coronary artery without angina pectoris: Secondary | ICD-10-CM | POA: Diagnosis not present

## 2022-01-28 DIAGNOSIS — Z23 Encounter for immunization: Secondary | ICD-10-CM | POA: Diagnosis not present

## 2022-01-28 DIAGNOSIS — M7662 Achilles tendinitis, left leg: Secondary | ICD-10-CM | POA: Diagnosis not present

## 2022-02-22 DIAGNOSIS — C44629 Squamous cell carcinoma of skin of left upper limb, including shoulder: Secondary | ICD-10-CM | POA: Diagnosis not present

## 2022-02-22 DIAGNOSIS — D225 Melanocytic nevi of trunk: Secondary | ICD-10-CM | POA: Diagnosis not present

## 2022-02-22 DIAGNOSIS — D485 Neoplasm of uncertain behavior of skin: Secondary | ICD-10-CM | POA: Diagnosis not present

## 2022-02-22 DIAGNOSIS — L578 Other skin changes due to chronic exposure to nonionizing radiation: Secondary | ICD-10-CM | POA: Diagnosis not present

## 2022-02-22 DIAGNOSIS — C44222 Squamous cell carcinoma of skin of right ear and external auricular canal: Secondary | ICD-10-CM | POA: Diagnosis not present

## 2022-02-22 DIAGNOSIS — L570 Actinic keratosis: Secondary | ICD-10-CM | POA: Diagnosis not present

## 2022-02-22 DIAGNOSIS — L821 Other seborrheic keratosis: Secondary | ICD-10-CM | POA: Diagnosis not present

## 2022-03-21 DIAGNOSIS — L03116 Cellulitis of left lower limb: Secondary | ICD-10-CM | POA: Diagnosis not present

## 2022-03-24 DIAGNOSIS — R7309 Other abnormal glucose: Secondary | ICD-10-CM | POA: Diagnosis not present

## 2022-03-24 DIAGNOSIS — Z0000 Encounter for general adult medical examination without abnormal findings: Secondary | ICD-10-CM | POA: Diagnosis not present

## 2022-03-24 DIAGNOSIS — I2510 Atherosclerotic heart disease of native coronary artery without angina pectoris: Secondary | ICD-10-CM | POA: Diagnosis not present

## 2022-03-31 DIAGNOSIS — I2510 Atherosclerotic heart disease of native coronary artery without angina pectoris: Secondary | ICD-10-CM | POA: Diagnosis not present

## 2022-03-31 DIAGNOSIS — I83893 Varicose veins of bilateral lower extremities with other complications: Secondary | ICD-10-CM | POA: Diagnosis not present

## 2022-03-31 DIAGNOSIS — J302 Other seasonal allergic rhinitis: Secondary | ICD-10-CM | POA: Diagnosis not present

## 2022-03-31 DIAGNOSIS — Z0000 Encounter for general adult medical examination without abnormal findings: Secondary | ICD-10-CM | POA: Diagnosis not present

## 2022-03-31 DIAGNOSIS — Z599 Problem related to housing and economic circumstances, unspecified: Secondary | ICD-10-CM | POA: Diagnosis not present

## 2022-03-31 DIAGNOSIS — F5101 Primary insomnia: Secondary | ICD-10-CM | POA: Diagnosis not present

## 2022-03-31 DIAGNOSIS — K219 Gastro-esophageal reflux disease without esophagitis: Secondary | ICD-10-CM | POA: Diagnosis not present

## 2022-03-31 DIAGNOSIS — Z789 Other specified health status: Secondary | ICD-10-CM | POA: Diagnosis not present

## 2022-03-31 DIAGNOSIS — L03115 Cellulitis of right lower limb: Secondary | ICD-10-CM | POA: Diagnosis not present

## 2022-04-13 DIAGNOSIS — L905 Scar conditions and fibrosis of skin: Secondary | ICD-10-CM | POA: Diagnosis not present

## 2022-04-13 DIAGNOSIS — C44629 Squamous cell carcinoma of skin of left upper limb, including shoulder: Secondary | ICD-10-CM | POA: Diagnosis not present

## 2022-04-27 DIAGNOSIS — C44222 Squamous cell carcinoma of skin of right ear and external auricular canal: Secondary | ICD-10-CM | POA: Diagnosis not present

## 2022-04-27 DIAGNOSIS — L905 Scar conditions and fibrosis of skin: Secondary | ICD-10-CM | POA: Diagnosis not present

## 2022-05-31 DIAGNOSIS — Z859 Personal history of malignant neoplasm, unspecified: Secondary | ICD-10-CM | POA: Diagnosis not present

## 2022-05-31 DIAGNOSIS — M721 Knuckle pads: Secondary | ICD-10-CM | POA: Diagnosis not present

## 2022-06-02 DIAGNOSIS — H524 Presbyopia: Secondary | ICD-10-CM | POA: Diagnosis not present

## 2022-06-07 DIAGNOSIS — N760 Acute vaginitis: Secondary | ICD-10-CM | POA: Diagnosis not present

## 2022-06-07 DIAGNOSIS — R3915 Urgency of urination: Secondary | ICD-10-CM | POA: Diagnosis not present

## 2022-06-07 DIAGNOSIS — L0390 Cellulitis, unspecified: Secondary | ICD-10-CM | POA: Diagnosis not present

## 2022-08-01 ENCOUNTER — Ambulatory Visit: Payer: Medicare HMO | Admitting: Physician Assistant

## 2022-08-01 ENCOUNTER — Encounter: Payer: Self-pay | Admitting: Physician Assistant

## 2022-08-01 VITALS — BP 131/70 | HR 73 | Wt 136.4 lb

## 2022-08-01 DIAGNOSIS — N3281 Overactive bladder: Secondary | ICD-10-CM

## 2022-08-01 DIAGNOSIS — Z87442 Personal history of urinary calculi: Secondary | ICD-10-CM

## 2022-08-01 DIAGNOSIS — R109 Unspecified abdominal pain: Secondary | ICD-10-CM | POA: Diagnosis not present

## 2022-08-01 DIAGNOSIS — R3129 Other microscopic hematuria: Secondary | ICD-10-CM | POA: Diagnosis not present

## 2022-08-01 LAB — URINALYSIS, COMPLETE
Bilirubin, UA: NEGATIVE
Glucose, UA: NEGATIVE
Ketones, UA: NEGATIVE
Leukocytes,UA: NEGATIVE
Nitrite, UA: NEGATIVE
Protein,UA: NEGATIVE
Specific Gravity, UA: <1.005 — ABNORMAL LOW (ref 1.005–1.030)
Urobilinogen, Ur: 0.2 mg/dL (ref 0.2–1.0)
pH, UA: 5.5 (ref 5.0–7.5)

## 2022-08-01 LAB — MICROSCOPIC EXAMINATION

## 2022-08-01 MED ORDER — MIRABEGRON ER 50 MG PO TB24: 50.0000 mg | ORAL_TABLET | Freq: Every day | ORAL | 11 refills | Status: DC

## 2022-08-01 NOTE — Patient Instructions (Signed)
We're going to see if Myrbetriq (mirabegron) works better for your bladder and is less expensive than the other medication we tried last time.  We also discussed other treatment options for your bladder including Botox injections in your bladder and PTNS, or percutaneous tibial nerve stimulation. Please call your insurance company to ask about the copay or any additional costs you would have for those treatments.

## 2022-08-01 NOTE — Progress Notes (Signed)
08/01/2022 10:20 AM   Kristina Proctor No [birth year] [id number]  CC: Chief Complaint  Patient presents with   MEDICATION MGMT   HPI: Kristina Proctor is a 77 y.o. female with PMH OAB wet who failed solifenacin and oxybutynin due to anticholinergic side effects who presents today for OAB follow-up.   Dr. Lonna Cobb gave her Gemtesa samples at her most recent clinic visit, however while this worked for her she did not fill the prescription due to cost.  Today she reports continued, bothersome mixed urge and stress incontinence.  She also reports 2 days of right low back pain with a history of nephrolithiasis.  No stone seen on KUB from 08/26/2020 or CTAP with contrast from 02/09/2021.  Her pain was responsive to a muscle relaxer.  She is a smoker.  PMH: Past Medical History:  Diagnosis Date   Overactive bladder    Venous insufficiency     Surgical History: Past Surgical History:  Procedure Laterality Date   APPENDECTOMY     CHOLECYSTECTOMY     CORONARY STENT INTERVENTION N/A 03/05/2021   Procedure: CORONARY STENT INTERVENTION;  Surgeon: Armando Reichert, MD;  Location: [facility] INVASIVE CV LAB;  Service: Cardiovascular;  Laterality: N/A;   LAPAROSCOPIC VAGINAL HYSTERECTOMY     LEFT HEART CATH AND CORONARY ANGIOGRAPHY N/A 03/05/2021   Procedure: LEFT HEART CATH AND CORONARY ANGIOGRAPHY;  Surgeon: Armando Reichert, MD;  Location: [facility] INVASIVE CV LAB;  Service: Cardiovascular;  Laterality: N/A;    Home Medications:  Allergies as of 08/01/2022       Reactions   Codeine Hives, Palpitations, Other (See Comments)   Rapid heart rate Rapid heart rate Rapid heart rate Rapid heart rate Rapid heart rate Rapid heart rate Rapid heart rate Rapid heart rate Rapid heart rate Rapid heart rate   Hydrocodone Palpitations, Other (See Comments)   Rapid heart rate Rapid heart rate Rapid heart rate Rapid heart rate   Oxycodone Hives, Palpitations, Other (See Comments)   Rapid heart rate Other  reaction(s): Chest Pain Other reaction(s): Chest Pain Rapid heart rate Rapid heart rate Rapid heart rate Rapid heart rate Rapid heart rate Rapid heart rate Other reaction(s): Chest Pain Rapid heart rate Rapid heart rate Rapid heart rate Other reaction(s): Chest Pain Rapid heart rate Rapid heart rate Rapid heart rate Rapid heart rate Rapid heart rate   Oxycodone Hcl Other (See Comments)   Rapid heart rate   Pravastatin Itching   And diarrhea   Amitriptyline Nausea Only, Other (See Comments)   Out of focus Out of focus Out of focus Out of focus Out of focus Out of focus Out of focus Out of focus Out of focus Out of focus   Cephalexin Nausea And Vomiting, Rash, Nausea Only   Levofloxacin Rash   Bupropion Nausea Only   Doxycycline Nausea Only   Ciprofloxacin Diarrhea        Medication List        Accurate as of August 01, 2022 10:20 AM. If you have any questions, ask your nurse or doctor.          STOP taking these medications    baclofen 10 MG tablet Commonly known as: LIORESAL Stopped by: Carman Ching, PA-C   estradiol 0.1 MG/GM vaginal cream Commonly known as: ESTRACE Stopped by: Carman Ching, PA-C   IBgard 90 MG Cpcr Generic drug: Peppermint Oil Stopped by: Carman Ching, PA-C   Melatonin 10 MG Tabs Stopped by: Carman Ching, PA-C   Premarin vaginal cream Generic drug:  conjugated estrogens Stopped by: Carman Ching, PA-C   rosuvastatin 10 MG tablet Commonly known as: CRESTOR Stopped by: Carman Ching, PA-C       TAKE these medications    aspirin EC 81 MG tablet Take 81 mg by mouth daily. Swallow whole.   Calcium Carb-Cholecalciferol 500-10 MG-MCG Chew Chew 2 each by mouth daily.   diphenhydrAMINE 25 MG tablet Commonly known as: BENADRYL Take 25 mg by mouth daily.   fluticasone 50 MCG/ACT nasal spray Commonly known as: FLONASE Place 2 sprays into both nostrils daily.   Gemtesa  75 MG Tabs Generic drug: Vibegron Take 75 mg by mouth daily.   gentamicin ointment 0.1 % Commonly known as: GARAMYCIN Apply 1 application topically 3 (three) times daily as needed (irritation around nose).   metoprolol succinate 25 MG 24 hr tablet Commonly known as: TOPROL-XL Take 25 mg by mouth daily.   multivitamin with minerals tablet Take 2 tablets by mouth daily.   solifenacin 10 MG tablet Commonly known as: VESICARE Take 10 mg by mouth daily as needed (overactive bladder). PT is out of it   traZODone 50 MG tablet Commonly known as: DESYREL Take 50 mg by mouth at bedtime.        Allergies:  Allergies  Allergen Reactions   Codeine Hives, Palpitations and Other (See Comments)    Rapid heart rate Rapid heart rate Rapid heart rate Rapid heart rate  Rapid heart rate Rapid heart rate Rapid heart rate Rapid heart rate Rapid heart rate Rapid heart rate   Hydrocodone Palpitations and Other (See Comments)    Rapid heart rate   Rapid heart rate Rapid heart rate Rapid heart rate   Oxycodone Hives, Palpitations and Other (See Comments)    Rapid heart rate Other reaction(s): Chest Pain Other reaction(s): Chest Pain Rapid heart rate Rapid heart rate Rapid heart rate Rapid heart rate Rapid heart rate  Rapid heart rate Other reaction(s): Chest Pain Rapid heart rate Rapid heart rate Rapid heart rate Other reaction(s): Chest Pain Rapid heart rate Rapid heart rate Rapid heart rate Rapid heart rate Rapid heart rate   Oxycodone Hcl Other (See Comments)    Rapid heart rate   Pravastatin Itching    And diarrhea   Amitriptyline Nausea Only and Other (See Comments)    Out of focus Out of focus Out of focus Out of focus  Out of focus Out of focus Out of focus Out of focus Out of focus Out of focus   Cephalexin Nausea And Vomiting, Rash and Nausea Only   Levofloxacin Rash   Bupropion Nausea Only   Doxycycline Nausea Only   Ciprofloxacin Diarrhea     Family History: Family History  Problem Relation Age of Onset   Breast cancer Maternal Aunt    Breast cancer Paternal Aunt     Social History:   reports that she quit smoking about 6 years ago. Her smoking use included cigarettes. She has a 30.00 pack-year smoking history. She has never used smokeless tobacco. She reports that she does not drink alcohol and does not use drugs.  Physical Exam: BP 131/70   Pulse 73   Wt 136 lb 6.4 oz (61.9 kg)   BMI 22.18 kg/m   Constitutional:  Alert and oriented, no acute distress, nontoxic appearing HEENT: [place], AT Cardiovascular: No clubbing, cyanosis, or edema Respiratory: Normal respiratory effort, no increased work of breathing Skin: No rashes, bruises or suspicious lesions Neurologic: Grossly intact, no focal deficits, moving all 4 extremities Psychiatric:  Normal mood and affect  Assessment & Plan:   1. Overactive bladder OAB wet with mixed urge and stress incontinence.  She could not tolerate anticholinergics due to side effects.  Leslye Peer was cost prohibitive.  We discussed various options including generic mirabegron 50 mg, intravesical Botox, and PTNS.  We did not discuss InterStim, though could consider this in the future.  She is open to all of the above but prefers to start with pharmacotherapy, which is reasonable.  Will see her back in clinic in 3 months for symptom recheck and PVR.  If we progressed with third line therapies, she seems most interested in PTNS and I gave her information about it in her AVS today so that she can inquire with her insurance company about co-pays. - mirabegron ER (MYRBETRIQ) 50 MG TB24 tablet; Take 1 tablet (50 mg total) by mouth daily.  Dispense: 30 tablet; Refill: 11  2. Flank pain with history of urolithiasis 2 days of right low back pain, strongly suspect musculoskeletal, we will obtain UA today and call her with the results.  If she has microscopic hematuria, will recommend pursuing hematuria  workup. - Urinalysis, Complete   Return in about 3 months (around 11/01/2022) for Symptom recheck with PVR, Will call with results.  Carman Ching, PA-C  [facility] [place] [address], [address] [place], [place] [address] [phone]

## 2022-08-02 DIAGNOSIS — F17200 Nicotine dependence, unspecified, uncomplicated: Secondary | ICD-10-CM | POA: Diagnosis not present

## 2022-08-02 DIAGNOSIS — I2510 Atherosclerotic heart disease of native coronary artery without angina pectoris: Secondary | ICD-10-CM | POA: Diagnosis not present

## 2022-08-02 DIAGNOSIS — I872 Venous insufficiency (chronic) (peripheral): Secondary | ICD-10-CM | POA: Diagnosis not present

## 2022-08-02 DIAGNOSIS — I340 Nonrheumatic mitral (valve) insufficiency: Secondary | ICD-10-CM | POA: Diagnosis not present

## 2022-08-02 DIAGNOSIS — E785 Hyperlipidemia, unspecified: Secondary | ICD-10-CM | POA: Diagnosis not present

## 2022-08-02 DIAGNOSIS — Z789 Other specified health status: Secondary | ICD-10-CM | POA: Diagnosis not present

## 2022-08-02 NOTE — Addendum Note (Signed)
Addended by: Debarah Crape on: 08/02/2022 03:38 PM   Modules accepted: Orders

## 2022-08-19 ENCOUNTER — Ambulatory Visit
Admission: RE | Admit: 2022-08-19 | Discharge: 2022-08-19 | Disposition: A | Discharge status: Home / Self Care | Payer: Medicare HMO | Source: Ambulatory Visit | Attending: Physician Assistant | Admitting: Physician Assistant

## 2022-08-19 DIAGNOSIS — K5730 Diverticulosis of large intestine without perforation or abscess without bleeding: Secondary | ICD-10-CM | POA: Diagnosis not present

## 2022-08-19 DIAGNOSIS — R109 Unspecified abdominal pain: Secondary | ICD-10-CM | POA: Diagnosis not present

## 2022-08-19 DIAGNOSIS — R3129 Other microscopic hematuria: Secondary | ICD-10-CM

## 2022-08-19 DIAGNOSIS — K7689 Other specified diseases of liver: Secondary | ICD-10-CM | POA: Diagnosis not present

## 2022-08-19 LAB — POCT I-STAT CREATININE: Creatinine, Ser: 0.7 mg/dL (ref 0.44–1.00)

## 2022-08-19 MED ORDER — IOHEXOL 300 MG/ML  SOLN
100.0000 mL | Freq: Once | INTRAMUSCULAR | Status: AC | PRN
  Administered 2022-08-19: 100 mL via INTRAVENOUS

## 2022-08-23 DIAGNOSIS — I340 Nonrheumatic mitral (valve) insufficiency: Secondary | ICD-10-CM | POA: Diagnosis not present

## 2022-08-23 DIAGNOSIS — I2510 Atherosclerotic heart disease of native coronary artery without angina pectoris: Secondary | ICD-10-CM | POA: Diagnosis not present

## 2022-08-25 ENCOUNTER — Encounter: Payer: Self-pay | Admitting: Urology

## 2022-08-25 ENCOUNTER — Ambulatory Visit: Payer: Medicare HMO | Admitting: Urology

## 2022-08-25 VITALS — BP 122/57 | HR 61 | Ht 65.0 in | Wt 137.4 lb

## 2022-08-25 DIAGNOSIS — N3281 Overactive bladder: Secondary | ICD-10-CM

## 2022-08-25 DIAGNOSIS — R3129 Other microscopic hematuria: Secondary | ICD-10-CM | POA: Diagnosis not present

## 2022-08-25 DIAGNOSIS — N3941 Urge incontinence: Secondary | ICD-10-CM

## 2022-08-25 LAB — URINALYSIS, COMPLETE
Bilirubin, UA: NEGATIVE
Glucose, UA: NEGATIVE
Ketones, UA: NEGATIVE
Leukocytes,UA: NEGATIVE
Nitrite, UA: NEGATIVE
Protein,UA: NEGATIVE
RBC, UA: NEGATIVE
Specific Gravity, UA: <1.005 — ABNORMAL LOW (ref 1.005–1.030)
Urobilinogen, Ur: 0.2 mg/dL (ref 0.2–1.0)
pH, UA: 6.5 (ref 5.0–7.5)

## 2022-08-25 LAB — MICROSCOPIC EXAMINATION
Bacteria, UA: NONE SEEN
RBC, Urine: NONE SEEN /hpf (ref 0–2)
WBC, UA: NONE SEEN /hpf (ref 0–5)

## 2022-08-25 MED ORDER — SOLIFENACIN SUCCINATE 10 MG PO TABS: 10.0000 mg | ORAL_TABLET | Freq: Every day | ORAL | 1 refills | Status: DC

## 2022-08-25 NOTE — Progress Notes (Signed)
   08/25/22  CC:  Chief Complaint  Patient presents with   Cysto    HPI: Refer to [facility]'s previous office note 08/01/2022.  CTU performed 08/19/2022 was personally reviewed and interpreted and I do not see any upper tract abnormalities.  The radiology read is pending.  She could not tolerate Myrbetriq secondary to significant constipation and discontinued the medication today she states she thinks the Solifenacin worked better than anything she has tried with the exception of Leslye Peer which was cost prohibitive.  She requested a repeat trial of Solifenacin.  Urinalysis today showed no RBCs  Blood pressure (!) 122/57, pulse 61, height 5\' 5"  (1.651 m), weight 137 lb 6 oz (62.3 kg). NED. A&Ox3.   No respiratory distress   Abd soft, NT, ND Normal external genitalia with patent urethral meatus  Cystoscopy Procedure Note  Patient identification was confirmed, informed consent was obtained, and patient was prepped using Betadine solution.  Lidocaine jelly was administered per urethral meatus.    Procedure: - Flexible cystoscope introduced, without any difficulty.   - Thorough search of the bladder revealed:    normal urethral meatus    normal urothelium    no stones    no ulcers     no tumors    no urethral polyps    no trabeculation  - Ureteral orifices were normal in position and appearance.  Post-Procedure: - Patient tolerated the procedure well  Assessment/ Plan: No bladder mucosal abnormalities No upper tract abnormalities on CTU-radiology read pending Unable to tolerate Myrbetriq; restart Solifenacin Keep scheduled follow-up October 2024   Riki Altes, MD

## 2022-09-16 ENCOUNTER — Other Ambulatory Visit: Payer: Self-pay | Admitting: Urology

## 2022-09-21 ENCOUNTER — Other Ambulatory Visit: Payer: Self-pay | Admitting: Urology

## 2022-09-21 DIAGNOSIS — I2510 Atherosclerotic heart disease of native coronary artery without angina pectoris: Secondary | ICD-10-CM | POA: Diagnosis not present

## 2022-09-21 DIAGNOSIS — R7309 Other abnormal glucose: Secondary | ICD-10-CM | POA: Diagnosis not present

## 2022-09-21 DIAGNOSIS — L03115 Cellulitis of right lower limb: Secondary | ICD-10-CM | POA: Diagnosis not present

## 2022-09-23 DIAGNOSIS — L03115 Cellulitis of right lower limb: Secondary | ICD-10-CM | POA: Diagnosis not present

## 2022-09-28 DIAGNOSIS — R7309 Other abnormal glucose: Secondary | ICD-10-CM | POA: Diagnosis not present

## 2022-09-28 DIAGNOSIS — R229 Localized swelling, mass and lump, unspecified: Secondary | ICD-10-CM | POA: Diagnosis not present

## 2022-09-28 DIAGNOSIS — K219 Gastro-esophageal reflux disease without esophagitis: Secondary | ICD-10-CM | POA: Diagnosis not present

## 2022-09-28 DIAGNOSIS — I2510 Atherosclerotic heart disease of native coronary artery without angina pectoris: Secondary | ICD-10-CM | POA: Diagnosis not present

## 2022-10-11 ENCOUNTER — Other Ambulatory Visit: Payer: Self-pay | Admitting: Family Medicine

## 2022-10-11 DIAGNOSIS — Z1231 Encounter for screening mammogram for malignant neoplasm of breast: Secondary | ICD-10-CM

## 2022-10-17 DIAGNOSIS — D485 Neoplasm of uncertain behavior of skin: Secondary | ICD-10-CM | POA: Diagnosis not present

## 2022-10-17 DIAGNOSIS — Z872 Personal history of diseases of the skin and subcutaneous tissue: Secondary | ICD-10-CM | POA: Diagnosis not present

## 2022-10-17 DIAGNOSIS — L578 Other skin changes due to chronic exposure to nonionizing radiation: Secondary | ICD-10-CM | POA: Diagnosis not present

## 2022-10-17 DIAGNOSIS — Z859 Personal history of malignant neoplasm, unspecified: Secondary | ICD-10-CM | POA: Diagnosis not present

## 2022-10-17 DIAGNOSIS — C44722 Squamous cell carcinoma of skin of right lower limb, including hip: Secondary | ICD-10-CM | POA: Diagnosis not present

## 2022-10-18 ENCOUNTER — Ambulatory Visit
Admission: RE | Admit: 2022-10-18 | Discharge: 2022-10-18 | Disposition: A | Discharge status: Home / Self Care | Payer: Medicare HMO | Source: Ambulatory Visit | Attending: Family Medicine | Admitting: Family Medicine

## 2022-10-18 DIAGNOSIS — Z1231 Encounter for screening mammogram for malignant neoplasm of breast: Secondary | ICD-10-CM | POA: Diagnosis not present

## 2022-11-01 ENCOUNTER — Ambulatory Visit: Payer: Medicare HMO | Admitting: Physician Assistant

## 2022-11-01 ENCOUNTER — Encounter: Payer: Self-pay | Admitting: Physician Assistant

## 2022-11-01 VITALS — BP 134/73 | HR 75 | Ht 65.0 in | Wt 137.6 lb

## 2022-11-01 DIAGNOSIS — N3281 Overactive bladder: Secondary | ICD-10-CM | POA: Diagnosis not present

## 2022-11-01 DIAGNOSIS — B3731 Acute candidiasis of vulva and vagina: Secondary | ICD-10-CM

## 2022-11-01 DIAGNOSIS — L03115 Cellulitis of right lower limb: Secondary | ICD-10-CM

## 2022-11-01 LAB — BLADDER SCAN AMB NON-IMAGING: Scan Result: 0

## 2022-11-01 MED ORDER — SOLIFENACIN SUCCINATE 10 MG PO TABS
10.0000 mg | ORAL_TABLET | Freq: Every day | ORAL | 11 refills | Status: DC
Start: 2022-11-01 — End: 2023-11-06

## 2022-11-01 MED ORDER — SULFAMETHOXAZOLE-TRIMETHOPRIM 800-160 MG PO TABS
1.0000 | ORAL_TABLET | Freq: Two times a day (BID) | ORAL | 0 refills | Status: AC
Start: 2022-11-01 — End: 2022-11-08

## 2022-11-01 MED ORDER — FLUCONAZOLE 150 MG PO TABS
150.0000 mg | ORAL_TABLET | Freq: Once | ORAL | 0 refills | Status: AC
Start: 2022-11-01 — End: 2022-11-01

## 2022-11-01 NOTE — Progress Notes (Unsigned)
11/01/2022 10:52 AM   Kristina Proctor No 02-Jun-1945 [id number]  CC: Chief Complaint  Patient presents with   Follow-up   HPI: Kristina Proctor is a 77 y.o. female with PMH OAB wet on Solifenacin who presents today for annual follow-up.   Today she reports she takes her Solifenacin in the afternoon and it is working well for her.  She was previously offered [place], but was unable to fill this due to cost.  She also reports a yeast infection and requests Diflucan.  Lastly, she recently underwent an excision versus biopsy on the right shin and has developed painful erythema surrounding the wound.  She noticed that the wound initially had a pus filled blister on it, however this spontaneously drained.  She has noticed some more proximal pustules associated.  She thinks she has an infection.  She contacted dermatology to be seen, but they were not able to offer her an appointment for 2 more weeks.  She is able to bear weight on the leg.  PVR 0mL.  PMH: Past Medical History:  Diagnosis Date   Overactive bladder    Venous insufficiency     Surgical History: Past Surgical History:  Procedure Laterality Date   APPENDECTOMY     CHOLECYSTECTOMY     CORONARY STENT INTERVENTION N/A 03/05/2021   Procedure: CORONARY STENT INTERVENTION;  Surgeon: Armando Reichert, MD;  Location: [facility] INVASIVE CV LAB;  Service: Cardiovascular;  Laterality: N/A;   LAPAROSCOPIC VAGINAL HYSTERECTOMY     LEFT HEART CATH AND CORONARY ANGIOGRAPHY N/A 03/05/2021   Procedure: LEFT HEART CATH AND CORONARY ANGIOGRAPHY;  Surgeon: Armando Reichert, MD;  Location: [facility] INVASIVE CV LAB;  Service: Cardiovascular;  Laterality: N/A;    Home Medications:  Allergies as of 11/01/2022       Reactions   Codeine Hives, Palpitations, Other (See Comments)   Rapid heart rate Rapid heart rate Rapid heart rate Rapid heart rate Rapid heart rate Rapid heart rate Rapid heart rate Rapid heart rate Rapid heart rate Rapid heart rate    Hydrocodone Palpitations, Other (See Comments)   Rapid heart rate Rapid heart rate Rapid heart rate Rapid heart rate   Oxycodone Hives, Palpitations, Other (See Comments)   Rapid heart rate Other reaction(s): Chest Pain Other reaction(s): Chest Pain Rapid heart rate Rapid heart rate Rapid heart rate Rapid heart rate Rapid heart rate Rapid heart rate Other reaction(s): Chest Pain Rapid heart rate Rapid heart rate Rapid heart rate Other reaction(s): Chest Pain Rapid heart rate Rapid heart rate Rapid heart rate Rapid heart rate Rapid heart rate   Oxycodone Hcl Other (See Comments)   Rapid heart rate   Pravastatin Itching   And diarrhea   Amitriptyline Nausea Only, Other (See Comments)   Out of focus Out of focus Out of focus Out of focus Out of focus Out of focus Out of focus Out of focus Out of focus Out of focus   Cephalexin Nausea And Vomiting, Rash, Nausea Only   Levofloxacin Rash   Bupropion Nausea Only   Doxycycline Nausea Only   Ciprofloxacin Diarrhea        Medication List        Accurate as of November 01, 2022 10:52 AM. If you have any questions, ask your nurse or doctor.          aspirin EC 81 MG tablet Take 81 mg by mouth daily. Swallow whole.   baclofen 10 MG tablet Commonly known as: LIORESAL Take 10 mg by mouth 3 (  three) times daily as needed.   Calcium Carb-Cholecalciferol 500-10 MG-MCG Chew Chew 2 each by mouth daily.   diphenhydrAMINE 25 MG tablet Commonly known as: BENADRYL Take 25 mg by mouth daily.   ezetimibe 10 MG tablet Commonly known as: ZETIA Take 10 mg by mouth daily.   fluconazole 150 MG tablet Commonly known as: DIFLUCAN Take 1 tablet (150 mg total) by mouth once for 1 dose. Started by: Carman Ching   fluticasone 50 MCG/ACT nasal spray Commonly known as: FLONASE Place 2 sprays into both nostrils daily.   gentamicin ointment 0.1 % Commonly known as: GARAMYCIN Apply 1 application topically 3  (three) times daily as needed (irritation around nose).   metoprolol succinate 25 MG 24 hr tablet Commonly known as: TOPROL-XL Take 25 mg by mouth daily.   multivitamin with minerals tablet Take 2 tablets by mouth daily.   solifenacin 10 MG tablet Commonly known as: VESICARE Take 1 tablet (10 mg total) by mouth daily.   sulfamethoxazole-trimethoprim 800-160 MG tablet Commonly known as: BACTRIM DS Take 1 tablet by mouth 2 (two) times daily for 7 days. Started by: Carman Ching   traZODone 50 MG tablet Commonly known as: DESYREL Take 50 mg by mouth at bedtime.        Allergies:  Allergies  Allergen Reactions   Codeine Hives, Palpitations and Other (See Comments)    Rapid heart rate Rapid heart rate Rapid heart rate Rapid heart rate  Rapid heart rate Rapid heart rate Rapid heart rate Rapid heart rate Rapid heart rate Rapid heart rate   Hydrocodone Palpitations and Other (See Comments)    Rapid heart rate   Rapid heart rate Rapid heart rate Rapid heart rate   Oxycodone Hives, Palpitations and Other (See Comments)    Rapid heart rate Other reaction(s): Chest Pain Other reaction(s): Chest Pain Rapid heart rate Rapid heart rate Rapid heart rate Rapid heart rate Rapid heart rate  Rapid heart rate Other reaction(s): Chest Pain Rapid heart rate Rapid heart rate Rapid heart rate Other reaction(s): Chest Pain Rapid heart rate Rapid heart rate Rapid heart rate Rapid heart rate Rapid heart rate   Oxycodone Hcl Other (See Comments)    Rapid heart rate   Pravastatin Itching    And diarrhea   Amitriptyline Nausea Only and Other (See Comments)    Out of focus Out of focus Out of focus Out of focus  Out of focus Out of focus Out of focus Out of focus Out of focus Out of focus   Cephalexin Nausea And Vomiting, Rash and Nausea Only   Levofloxacin Rash   Bupropion Nausea Only   Doxycycline Nausea Only   Ciprofloxacin Diarrhea    Family  History: Family History  Problem Relation Age of Onset   Breast cancer Maternal Aunt    Breast cancer Paternal Aunt     Social History:   reports that she quit smoking about 6 years ago. Her smoking use included cigarettes. She started smoking about 36 years ago. She has a 30 pack-year smoking history. She has never used smokeless tobacco. She reports that she does not drink alcohol and does not use drugs.  Physical Exam: BP 134/73   Pulse 75   Ht 5\' 5"  (1.651 m)   Wt 137 lb 9.6 oz (62.4 kg)   BMI 22.90 kg/m   Constitutional:  Alert and oriented, no acute distress, nontoxic appearing HEENT: [place], AT Cardiovascular: No clubbing, cyanosis, or edema Respiratory: Normal respiratory effort, no increased work  of breathing Skin: Tender, nonraised erythema surrounding the location of her right shin excision versus biopsy.  3 proximal pustules.  No crepitus, visible bone, or lymphatic streaking. Neurologic: Grossly intact, no focal deficits, moving all 4 extremities Psychiatric: Normal mood and affect  Laboratory Data: Results for orders placed or performed in visit on 11/01/22  BLADDER SCAN AMB NON-IMAGING  Result Value Ref Range   Scan Result 0 ml    Assessment & Plan:   1. Overactive bladder Well-managed on Vesicare and she is emptying appropriately.  Will continue this. - BLADDER SCAN AMB NON-IMAGING - solifenacin (VESICARE) 10 MG tablet; Take 1 tablet (10 mg total) by mouth daily.  Dispense: 30 tablet; Refill: 11  2. Cellulitis of right lower extremity Physical exam is consistent with cellulitis following excision versus biopsy of the right shin.  Will start empiric Bactrim since she has attempted but is unable to be seen by dermatology any sooner.  I marked the border of her erythema today.  We had an extensive conversation about return precautions including fever, spreading erythema, or loss of the ability to bear weight on the right lower extremity.  In this case, I urged her to  proceed to the emergency department.  She understands that this is not a urologic problem and I will not be able to treat her for nonurologic problems in the future. - sulfamethoxazole-trimethoprim (BACTRIM DS) 800-160 MG tablet; Take 1 tablet by mouth 2 (two) times daily for 7 days.  Dispense: 14 tablet; Refill: 0  3. Vulvovaginal candidiasis 1 dose of Diflucan to treat VVC per patient request. - fluconazole (DIFLUCAN) 150 MG tablet; Take 1 tablet (150 mg total) by mouth once for 1 dose.  Dispense: 1 tablet; Refill: 0  Return in about 1 year (around 11/01/2023) for Annual OAB f/u with PVR.  Carman Ching, PA-C  [facility] [place] [address] [place], [place] [address] [phone]

## 2022-11-15 DIAGNOSIS — C44722 Squamous cell carcinoma of skin of right lower limb, including hip: Secondary | ICD-10-CM | POA: Diagnosis not present

## 2022-11-22 DIAGNOSIS — L0390 Cellulitis, unspecified: Secondary | ICD-10-CM | POA: Diagnosis not present

## 2022-11-29 DIAGNOSIS — L0390 Cellulitis, unspecified: Secondary | ICD-10-CM | POA: Diagnosis not present

## 2022-12-06 DIAGNOSIS — L0390 Cellulitis, unspecified: Secondary | ICD-10-CM | POA: Diagnosis not present

## 2022-12-09 DIAGNOSIS — H903 Sensorineural hearing loss, bilateral: Secondary | ICD-10-CM | POA: Diagnosis not present

## 2022-12-13 DIAGNOSIS — Z859 Personal history of malignant neoplasm, unspecified: Secondary | ICD-10-CM | POA: Diagnosis not present

## 2023-01-04 DIAGNOSIS — I872 Venous insufficiency (chronic) (peripheral): Secondary | ICD-10-CM | POA: Diagnosis not present

## 2023-01-04 DIAGNOSIS — C44722 Squamous cell carcinoma of skin of right lower limb, including hip: Secondary | ICD-10-CM | POA: Diagnosis not present

## 2023-01-26 DIAGNOSIS — N760 Acute vaginitis: Secondary | ICD-10-CM | POA: Diagnosis not present

## 2023-01-26 DIAGNOSIS — M5441 Lumbago with sciatica, right side: Secondary | ICD-10-CM | POA: Diagnosis not present

## 2023-01-26 DIAGNOSIS — G8929 Other chronic pain: Secondary | ICD-10-CM | POA: Diagnosis not present

## 2023-01-26 DIAGNOSIS — R3989 Other symptoms and signs involving the genitourinary system: Secondary | ICD-10-CM | POA: Diagnosis not present

## 2023-02-02 DIAGNOSIS — F17200 Nicotine dependence, unspecified, uncomplicated: Secondary | ICD-10-CM | POA: Diagnosis not present

## 2023-02-02 DIAGNOSIS — Z789 Other specified health status: Secondary | ICD-10-CM | POA: Diagnosis not present

## 2023-02-02 DIAGNOSIS — I2510 Atherosclerotic heart disease of native coronary artery without angina pectoris: Secondary | ICD-10-CM | POA: Diagnosis not present

## 2023-02-14 DIAGNOSIS — C44722 Squamous cell carcinoma of skin of right lower limb, including hip: Secondary | ICD-10-CM | POA: Diagnosis not present

## 2023-02-27 DIAGNOSIS — L0390 Cellulitis, unspecified: Secondary | ICD-10-CM | POA: Diagnosis not present

## 2023-03-06 DIAGNOSIS — L0390 Cellulitis, unspecified: Secondary | ICD-10-CM | POA: Diagnosis not present

## 2023-03-13 DIAGNOSIS — Z859 Personal history of malignant neoplasm, unspecified: Secondary | ICD-10-CM | POA: Diagnosis not present

## 2023-03-13 DIAGNOSIS — L0390 Cellulitis, unspecified: Secondary | ICD-10-CM | POA: Diagnosis not present

## 2023-03-29 DIAGNOSIS — I2510 Atherosclerotic heart disease of native coronary artery without angina pectoris: Secondary | ICD-10-CM | POA: Diagnosis not present

## 2023-03-29 DIAGNOSIS — R7309 Other abnormal glucose: Secondary | ICD-10-CM | POA: Diagnosis not present

## 2023-03-29 DIAGNOSIS — Z0000 Encounter for general adult medical examination without abnormal findings: Secondary | ICD-10-CM | POA: Diagnosis not present

## 2023-04-05 DIAGNOSIS — M545 Low back pain, unspecified: Secondary | ICD-10-CM | POA: Diagnosis not present

## 2023-04-05 DIAGNOSIS — G8929 Other chronic pain: Secondary | ICD-10-CM | POA: Diagnosis not present

## 2023-04-05 DIAGNOSIS — R7309 Other abnormal glucose: Secondary | ICD-10-CM | POA: Diagnosis not present

## 2023-04-05 DIAGNOSIS — Z0000 Encounter for general adult medical examination without abnormal findings: Secondary | ICD-10-CM | POA: Diagnosis not present

## 2023-04-05 DIAGNOSIS — Z720 Tobacco use: Secondary | ICD-10-CM | POA: Diagnosis not present

## 2023-04-05 DIAGNOSIS — I2510 Atherosclerotic heart disease of native coronary artery without angina pectoris: Secondary | ICD-10-CM | POA: Diagnosis not present

## 2023-04-05 DIAGNOSIS — F5101 Primary insomnia: Secondary | ICD-10-CM | POA: Diagnosis not present

## 2023-04-05 DIAGNOSIS — K219 Gastro-esophageal reflux disease without esophagitis: Secondary | ICD-10-CM | POA: Diagnosis not present

## 2023-04-05 DIAGNOSIS — I83893 Varicose veins of bilateral lower extremities with other complications: Secondary | ICD-10-CM | POA: Diagnosis not present

## 2023-04-11 DIAGNOSIS — D0471 Carcinoma in situ of skin of right lower limb, including hip: Secondary | ICD-10-CM | POA: Diagnosis not present

## 2023-04-11 DIAGNOSIS — I872 Venous insufficiency (chronic) (peripheral): Secondary | ICD-10-CM | POA: Diagnosis not present

## 2023-06-06 DIAGNOSIS — H26491 Other secondary cataract, right eye: Secondary | ICD-10-CM | POA: Diagnosis not present

## 2023-06-06 DIAGNOSIS — H524 Presbyopia: Secondary | ICD-10-CM | POA: Diagnosis not present

## 2023-06-19 DIAGNOSIS — H6002 Abscess of left external ear: Secondary | ICD-10-CM | POA: Diagnosis not present

## 2023-06-19 DIAGNOSIS — L720 Epidermal cyst: Secondary | ICD-10-CM | POA: Diagnosis not present

## 2023-06-19 DIAGNOSIS — L570 Actinic keratosis: Secondary | ICD-10-CM | POA: Diagnosis not present

## 2023-07-10 DIAGNOSIS — M5442 Lumbago with sciatica, left side: Secondary | ICD-10-CM | POA: Diagnosis not present

## 2023-07-10 DIAGNOSIS — M79672 Pain in left foot: Secondary | ICD-10-CM | POA: Diagnosis not present

## 2023-08-07 DIAGNOSIS — I83893 Varicose veins of bilateral lower extremities with other complications: Secondary | ICD-10-CM | POA: Diagnosis not present

## 2023-08-07 DIAGNOSIS — E785 Hyperlipidemia, unspecified: Secondary | ICD-10-CM | POA: Diagnosis not present

## 2023-08-07 DIAGNOSIS — I2510 Atherosclerotic heart disease of native coronary artery without angina pectoris: Secondary | ICD-10-CM | POA: Diagnosis not present

## 2023-08-07 DIAGNOSIS — F17200 Nicotine dependence, unspecified, uncomplicated: Secondary | ICD-10-CM | POA: Diagnosis not present

## 2023-08-07 DIAGNOSIS — I872 Venous insufficiency (chronic) (peripheral): Secondary | ICD-10-CM | POA: Diagnosis not present

## 2023-08-07 DIAGNOSIS — Z789 Other specified health status: Secondary | ICD-10-CM | POA: Diagnosis not present

## 2023-08-13 DIAGNOSIS — R300 Dysuria: Secondary | ICD-10-CM | POA: Diagnosis not present

## 2023-08-13 DIAGNOSIS — B3731 Acute candidiasis of vulva and vagina: Secondary | ICD-10-CM | POA: Diagnosis not present

## 2023-08-13 DIAGNOSIS — N3001 Acute cystitis with hematuria: Secondary | ICD-10-CM | POA: Diagnosis not present

## 2023-08-13 DIAGNOSIS — R350 Frequency of micturition: Secondary | ICD-10-CM | POA: Diagnosis not present

## 2023-08-14 DIAGNOSIS — I2510 Atherosclerotic heart disease of native coronary artery without angina pectoris: Secondary | ICD-10-CM | POA: Diagnosis not present

## 2023-09-11 DIAGNOSIS — I781 Nevus, non-neoplastic: Secondary | ICD-10-CM | POA: Diagnosis not present

## 2023-09-11 DIAGNOSIS — I2510 Atherosclerotic heart disease of native coronary artery without angina pectoris: Secondary | ICD-10-CM | POA: Diagnosis not present

## 2023-09-11 DIAGNOSIS — L578 Other skin changes due to chronic exposure to nonionizing radiation: Secondary | ICD-10-CM | POA: Diagnosis not present

## 2023-09-11 DIAGNOSIS — L249 Irritant contact dermatitis, unspecified cause: Secondary | ICD-10-CM | POA: Diagnosis not present

## 2023-09-11 DIAGNOSIS — L281 Prurigo nodularis: Secondary | ICD-10-CM | POA: Diagnosis not present

## 2023-09-11 DIAGNOSIS — R7309 Other abnormal glucose: Secondary | ICD-10-CM | POA: Diagnosis not present

## 2023-09-11 DIAGNOSIS — D492 Neoplasm of unspecified behavior of bone, soft tissue, and skin: Secondary | ICD-10-CM | POA: Diagnosis not present

## 2023-09-11 DIAGNOSIS — D485 Neoplasm of uncertain behavior of skin: Secondary | ICD-10-CM | POA: Diagnosis not present

## 2023-09-11 DIAGNOSIS — L570 Actinic keratosis: Secondary | ICD-10-CM | POA: Diagnosis not present

## 2023-09-11 DIAGNOSIS — Z872 Personal history of diseases of the skin and subcutaneous tissue: Secondary | ICD-10-CM | POA: Diagnosis not present

## 2023-09-11 DIAGNOSIS — Z859 Personal history of malignant neoplasm, unspecified: Secondary | ICD-10-CM | POA: Diagnosis not present

## 2023-09-16 DIAGNOSIS — M545 Low back pain, unspecified: Secondary | ICD-10-CM | POA: Diagnosis not present

## 2023-09-16 DIAGNOSIS — R590 Localized enlarged lymph nodes: Secondary | ICD-10-CM | POA: Diagnosis not present

## 2023-09-16 DIAGNOSIS — N390 Urinary tract infection, site not specified: Secondary | ICD-10-CM | POA: Diagnosis not present

## 2023-09-29 DIAGNOSIS — R109 Unspecified abdominal pain: Secondary | ICD-10-CM | POA: Diagnosis not present

## 2023-09-29 DIAGNOSIS — Z789 Other specified health status: Secondary | ICD-10-CM | POA: Diagnosis not present

## 2023-09-29 DIAGNOSIS — R1031 Right lower quadrant pain: Secondary | ICD-10-CM | POA: Diagnosis not present

## 2023-09-29 DIAGNOSIS — N3946 Mixed incontinence: Secondary | ICD-10-CM | POA: Diagnosis not present

## 2023-09-29 DIAGNOSIS — M545 Low back pain, unspecified: Secondary | ICD-10-CM | POA: Diagnosis not present

## 2023-09-29 DIAGNOSIS — R1013 Epigastric pain: Secondary | ICD-10-CM | POA: Diagnosis not present

## 2023-09-29 DIAGNOSIS — I2510 Atherosclerotic heart disease of native coronary artery without angina pectoris: Secondary | ICD-10-CM | POA: Diagnosis not present

## 2023-09-29 DIAGNOSIS — G8929 Other chronic pain: Secondary | ICD-10-CM | POA: Diagnosis not present

## 2023-09-29 DIAGNOSIS — N760 Acute vaginitis: Secondary | ICD-10-CM | POA: Diagnosis not present

## 2023-10-30 ENCOUNTER — Ambulatory Visit: Admitting: Physician Assistant

## 2023-10-30 DIAGNOSIS — H00015 Hordeolum externum left lower eyelid: Secondary | ICD-10-CM | POA: Diagnosis not present

## 2023-10-30 DIAGNOSIS — J309 Allergic rhinitis, unspecified: Secondary | ICD-10-CM | POA: Diagnosis not present

## 2023-10-30 DIAGNOSIS — N898 Other specified noninflammatory disorders of vagina: Secondary | ICD-10-CM | POA: Diagnosis not present

## 2023-10-31 ENCOUNTER — Ambulatory Visit: Admitting: Physician Assistant

## 2023-10-31 ENCOUNTER — Ambulatory Visit: Payer: Self-pay | Admitting: Physician Assistant

## 2023-11-03 ENCOUNTER — Ambulatory Visit: Admitting: Physician Assistant

## 2023-11-06 ENCOUNTER — Ambulatory Visit (INDEPENDENT_AMBULATORY_CARE_PROVIDER_SITE_OTHER): Admitting: Physician Assistant

## 2023-11-06 ENCOUNTER — Encounter: Payer: Self-pay | Admitting: Physician Assistant

## 2023-11-06 ENCOUNTER — Other Ambulatory Visit: Payer: Self-pay | Admitting: Physician Assistant

## 2023-11-06 VITALS — BP 142/69 | HR 65 | Ht 65.0 in | Wt 128.0 lb

## 2023-11-06 DIAGNOSIS — N3281 Overactive bladder: Secondary | ICD-10-CM | POA: Diagnosis not present

## 2023-11-06 LAB — BLADDER SCAN AMB NON-IMAGING

## 2023-11-06 MED ORDER — TROSPIUM CHLORIDE 20 MG PO TABS: 20.0000 mg | ORAL_TABLET | Freq: Two times a day (BID) | ORAL | 11 refills | Status: DC

## 2023-11-09 NOTE — Progress Notes (Signed)
 11/06/2023 1:31 PM   Kristina Proctor 09/16/1945 [id number]  CC: Chief Complaint  Patient presents with   Follow-up   HPI: Kristina Proctor is a 78 y.o. female with PMH OAB wet on solifenacin  who presents today for annual follow-up.   Today she reports her urinary symptoms are largely well-controlled.  She only leaks when she holds the urine for too long.  She has started working part-time and the medication helps her get through her work shifts.  She received a letter from [facility] expressing concern over the use of antimuscarinic medications given her age.  Despite this, it does not look like Myrbetriq , Gemtesa , or trospium ER will be affordable options for her.  PVR 16 mL.  PMH: Past Medical History:  Diagnosis Date   Overactive bladder    Venous insufficiency     Surgical History: Past Surgical History:  Procedure Laterality Date   APPENDECTOMY     CHOLECYSTECTOMY     CORONARY STENT INTERVENTION N/A 03/05/2021   Procedure: CORONARY STENT INTERVENTION;  Surgeon: Lawyer Bernardino Cough, MD;  Location: [facility] INVASIVE CV LAB;  Service: Cardiovascular;  Laterality: N/A;   LAPAROSCOPIC VAGINAL HYSTERECTOMY     LEFT HEART CATH AND CORONARY ANGIOGRAPHY N/A 03/05/2021   Procedure: LEFT HEART CATH AND CORONARY ANGIOGRAPHY;  Surgeon: Lawyer Bernardino Cough, MD;  Location: [facility] INVASIVE CV LAB;  Service: Cardiovascular;  Laterality: N/A;    Home Medications:  Allergies as of 11/06/2023       Reactions   Codeine Hives, Palpitations, Other (See Comments)   Rapid heart rate Rapid heart rate Rapid heart rate Rapid heart rate Rapid heart rate Rapid heart rate Rapid heart rate Rapid heart rate Rapid heart rate Rapid heart rate   Hydrocodone Palpitations, Other (See Comments)   Rapid heart rate Rapid heart rate Rapid heart rate Rapid heart rate   Oxycodone Hives, Palpitations, Other (See Comments)   Rapid heart rate Other reaction(s): Chest Pain Other reaction(s): Chest Pain Rapid heart  rate Rapid heart rate Rapid heart rate Rapid heart rate Rapid heart rate Rapid heart rate Other reaction(s): Chest Pain Rapid heart rate Rapid heart rate Rapid heart rate Other reaction(s): Chest Pain Rapid heart rate Rapid heart rate Rapid heart rate Rapid heart rate Rapid heart rate   Oxycodone Hcl Other (See Comments)   Rapid heart rate   Pravastatin Itching   And diarrhea   Amitriptyline Nausea Only, Other (See Comments)   Out of focus Out of focus Out of focus Out of focus Out of focus Out of focus Out of focus Out of focus Out of focus Out of focus   Cephalexin Nausea And Vomiting, Rash, Nausea Only   Levofloxacin Rash   Bupropion Nausea Only   Doxycycline Nausea Only   Ciprofloxacin Diarrhea        Medication List        Accurate as of November 06, 2023 11:59 PM. If you have any questions, ask your nurse or doctor.          STOP taking these medications    solifenacin  10 MG tablet Commonly known as: VESICARE  Stopped by: Lucie Hones       TAKE these medications    aspirin  EC 81 MG tablet Take 81 mg by mouth daily. Swallow whole.   baclofen  10 MG tablet Commonly known as: LIORESAL  Take 10 mg by mouth 3 (three) times daily as needed.   Calcium  Carb-Cholecalciferol 500-10 MG-MCG Chew Chew 2 each by mouth daily.   diphenhydrAMINE  25  MG tablet Commonly known as: BENADRYL  Take 25 mg by mouth daily.   ezetimibe 10 MG tablet Commonly known as: ZETIA Take 10 mg by mouth daily.   fluticasone  50 MCG/ACT nasal spray Commonly known as: FLONASE  Place 2 sprays into both nostrils daily.   gentamicin ointment 0.1 % Commonly known as: GARAMYCIN Apply 1 application topically 3 (three) times daily as needed (irritation around nose).   metoprolol  succinate 25 MG 24 hr tablet Commonly known as: TOPROL -XL Take 25 mg by mouth daily.   multivitamin with minerals tablet Take 2 tablets by mouth daily.   traZODone  50 MG tablet Commonly  known as: DESYREL  Take 50 mg by mouth at bedtime.   trospium 20 MG tablet Commonly known as: SANCTURA Take 1 tablet (20 mg total) by mouth 2 (two) times daily. Started by: Jalon Blackwelder        Allergies:  Allergies  Allergen Reactions   Codeine Hives, Palpitations and Other (See Comments)    Rapid heart rate Rapid heart rate Rapid heart rate Rapid heart rate  Rapid heart rate Rapid heart rate Rapid heart rate Rapid heart rate Rapid heart rate Rapid heart rate   Hydrocodone Palpitations and Other (See Comments)    Rapid heart rate   Rapid heart rate Rapid heart rate Rapid heart rate   Oxycodone Hives, Palpitations and Other (See Comments)    Rapid heart rate Other reaction(s): Chest Pain Other reaction(s): Chest Pain Rapid heart rate Rapid heart rate Rapid heart rate Rapid heart rate Rapid heart rate  Rapid heart rate Other reaction(s): Chest Pain Rapid heart rate Rapid heart rate Rapid heart rate Other reaction(s): Chest Pain Rapid heart rate Rapid heart rate Rapid heart rate Rapid heart rate Rapid heart rate   Oxycodone Hcl Other (See Comments)    Rapid heart rate   Pravastatin Itching    And diarrhea   Amitriptyline Nausea Only and Other (See Comments)    Out of focus Out of focus Out of focus Out of focus  Out of focus Out of focus Out of focus Out of focus Out of focus Out of focus   Cephalexin Nausea And Vomiting, Rash and Nausea Only   Levofloxacin Rash   Bupropion Nausea Only   Doxycycline Nausea Only   Ciprofloxacin Diarrhea    Family History: Family History  Problem Relation Age of Onset   Breast cancer Maternal Aunt    Breast cancer Paternal Aunt     Social History:   reports that she quit smoking about 7 years ago. Her smoking use included cigarettes. She started smoking about 37 years ago. She has a 30 pack-year smoking history. She has never used smokeless tobacco. She reports that she does not drink alcohol  and does not use drugs.  Physical Exam: BP (!) 142/69   Pulse 65   Ht 5' 5 (1.651 m)   Wt 128 lb (58.1 kg)   BMI 21.30 kg/m   Constitutional:  Alert and oriented, no acute distress, nontoxic appearing HEENT: [place], AT Cardiovascular: No clubbing, cyanosis, or edema Respiratory: Normal respiratory effort, no increased work of breathing Skin: No rashes, bruises or suspicious lesions Neurologic: Grossly intact, no focal deficits, moving all 4 extremities Psychiatric: Normal mood and affect  Laboratory Data: Results for orders placed or performed in visit on 11/06/23  BLADDER SCAN AMB NON-IMAGING   Collection Time: 11/06/23 11:42 AM  Result Value Ref Range   Scan Result 16ml    Assessment & Plan:   1. Overactive  bladder (Primary) Emptying appropriately on Vesicare .  I agree that this is not the safest option from a cognitive perspective for patients over the age of 100, however safer options will be largely cost prohibitive for her.  Will try to switch her to trospium IR 20 mg twice daily and see if she is able to tolerate this without anticholinergic side effects.  If not, we may need to put her back on Vesicare  or consider nonpharmacologic options. - BLADDER SCAN AMB NON-IMAGING - trospium (SANCTURA) 20 MG tablet; Take 1 tablet (20 mg total) by mouth 2 (two) times daily.  Dispense: 60 tablet; Refill: 11  Return in about 1 year (around 11/05/2024) for Annual OAB f/u with PVR.  Lucie Hones, PA-C  [facility] [place] [address] [place], [place] [address] [phone]

## 2023-11-10 ENCOUNTER — Telehealth: Payer: Self-pay | Admitting: Physician Assistant

## 2023-11-10 NOTE — Telephone Encounter (Signed)
 Unfortunately, Gemtesa  and Myrbetriq  will still be cost prohibitive for her with her insurance.  Her options would be to go back on the Vesicare , with the cognitive side effects that we discussed in clinic, or set her up for an office visit with me to discuss nonmedication options to treat her overactive bladder.

## 2023-11-10 NOTE — Telephone Encounter (Signed)
 Pt walked into office and requested an alternative for the prescription Trospium. Pt is stating they are having side effects  and is requesting a call back for clarification.

## 2023-11-13 NOTE — Telephone Encounter (Signed)
 Spoke with patient and informed her per Sam Gemtesa  and Myrbetriq  will still be cost prohibitive for her with her insurance. Her options would be to go back on the Vesicare , with the cognitive side effects that they discussed in clinic, or set her up for an office visit with Sam to discuss nonmedication options to treat her overactive bladder. Patient states she would just like to go back on the Vesicare . Verified her correct pharmacy and will have Sam send it in. Patient would like to schedule appointment as well and will call back in the office once she verifies her physical therapy appointment on Monday October 27 th.

## 2023-11-16 ENCOUNTER — Other Ambulatory Visit: Payer: Self-pay | Admitting: Physician Assistant

## 2023-11-16 DIAGNOSIS — N3281 Overactive bladder: Secondary | ICD-10-CM

## 2023-11-20 ENCOUNTER — Telehealth: Payer: Self-pay | Admitting: Physician Assistant

## 2023-11-20 DIAGNOSIS — N3281 Overactive bladder: Secondary | ICD-10-CM

## 2023-11-20 NOTE — Telephone Encounter (Signed)
 Pt walked into office and requested prescription Solifenacin  and would like a call back with clarification and pt also wanted to know if she needed an apt to discuss medication?

## 2023-11-23 MED ORDER — SOLIFENACIN SUCCINATE 10 MG PO TABS: 10.0000 mg | ORAL_TABLET | Freq: Every day | ORAL | 11 refills | Status: AC

## 2023-11-23 NOTE — Telephone Encounter (Signed)
 See prior telephone note.  I just sent in the Vesicare  prescription.

## 2023-11-24 NOTE — Telephone Encounter (Signed)
 1st phone call attempted, No answer. Voicemail left for patient to return call to clinic.

## 2023-11-24 NOTE — Telephone Encounter (Signed)
Phone call attempted   

## 2023-11-24 NOTE — Telephone Encounter (Signed)
 Patient walked in to clinic today and I advised her medication was sent.

## 2023-11-27 DIAGNOSIS — G8929 Other chronic pain: Secondary | ICD-10-CM | POA: Diagnosis not present

## 2023-11-27 DIAGNOSIS — M545 Low back pain, unspecified: Secondary | ICD-10-CM | POA: Diagnosis not present

## 2023-12-11 DIAGNOSIS — G8929 Other chronic pain: Secondary | ICD-10-CM | POA: Diagnosis not present

## 2023-12-11 DIAGNOSIS — M545 Low back pain, unspecified: Secondary | ICD-10-CM | POA: Diagnosis not present

## 2023-12-20 ENCOUNTER — Other Ambulatory Visit: Payer: Self-pay | Admitting: Family Medicine

## 2023-12-20 DIAGNOSIS — Z1231 Encounter for screening mammogram for malignant neoplasm of breast: Secondary | ICD-10-CM

## 2024-02-09 ENCOUNTER — Ambulatory Visit
Admission: RE | Admit: 2024-02-09 | Discharge: 2024-02-09 | Disposition: A | Source: Ambulatory Visit | Attending: Family Medicine | Admitting: Family Medicine

## 2024-02-09 DIAGNOSIS — Z1231 Encounter for screening mammogram for malignant neoplasm of breast: Secondary | ICD-10-CM | POA: Diagnosis present

## 2024-11-05 ENCOUNTER — Ambulatory Visit: Admitting: Physician Assistant
